# Patient Record
Sex: Male | Born: 2000 | Race: Black or African American | Hispanic: No | Marital: Single | Smoking: Former smoker
Health system: Southern US, Community
[De-identification: ages and names within clinical notes are randomized; demographics above are authoritative.]

## PROBLEM LIST (undated history)

## (undated) DIAGNOSIS — F419 Anxiety disorder, unspecified: Secondary | ICD-10-CM

## (undated) DIAGNOSIS — F32A Depression, unspecified: Secondary | ICD-10-CM

## (undated) DIAGNOSIS — L309 Dermatitis, unspecified: Secondary | ICD-10-CM

## (undated) DIAGNOSIS — J45909 Unspecified asthma, uncomplicated: Secondary | ICD-10-CM

## (undated) DIAGNOSIS — F329 Major depressive disorder, single episode, unspecified: Secondary | ICD-10-CM

---
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
## 2018-02-04 ENCOUNTER — Emergency Department (HOSPITAL_COMMUNITY)
Admission: EM | Admit: 2018-02-04 | Discharge: 2018-02-04 | Discharge status: Against Medical Advice | Payer: Medicaid Other | Attending: Emergency Medicine | Admitting: Emergency Medicine

## 2018-02-04 ENCOUNTER — Other Ambulatory Visit: Payer: Self-pay

## 2018-02-04 ENCOUNTER — Encounter (HOSPITAL_COMMUNITY): Payer: Self-pay | Admitting: Emergency Medicine

## 2018-02-04 DIAGNOSIS — R531 Weakness: Secondary | ICD-10-CM | POA: Diagnosis not present

## 2018-02-04 DIAGNOSIS — Z5321 Procedure and treatment not carried out due to patient leaving prior to being seen by health care provider: Secondary | ICD-10-CM | POA: Diagnosis not present

## 2018-02-04 NOTE — ED Notes (Addendum)
Called x3 for room no answer 

## 2018-02-04 NOTE — ED Triage Notes (Addendum)
Pt presents with EMS complaint generalized aches, chills, and cough this past week; worsening weakness related to such. Denies v/d.

## 2018-02-05 ENCOUNTER — Emergency Department (HOSPITAL_COMMUNITY)
Admission: EM | Admit: 2018-02-05 | Discharge: 2018-02-05 | Disposition: A | Discharge status: Home / Self Care | Payer: Medicaid Other | Attending: Emergency Medicine | Admitting: Emergency Medicine

## 2018-02-05 ENCOUNTER — Encounter (HOSPITAL_COMMUNITY): Payer: Self-pay | Admitting: *Deleted

## 2018-02-05 ENCOUNTER — Emergency Department (HOSPITAL_COMMUNITY): Payer: Medicaid Other

## 2018-02-05 DIAGNOSIS — F1290 Cannabis use, unspecified, uncomplicated: Secondary | ICD-10-CM | POA: Diagnosis not present

## 2018-02-05 DIAGNOSIS — R112 Nausea with vomiting, unspecified: Secondary | ICD-10-CM | POA: Diagnosis not present

## 2018-02-05 DIAGNOSIS — J069 Acute upper respiratory infection, unspecified: Secondary | ICD-10-CM | POA: Diagnosis not present

## 2018-02-05 DIAGNOSIS — F17210 Nicotine dependence, cigarettes, uncomplicated: Secondary | ICD-10-CM | POA: Insufficient documentation

## 2018-02-05 DIAGNOSIS — R05 Cough: Secondary | ICD-10-CM | POA: Diagnosis present

## 2018-02-05 MED ORDER — BENZONATATE 100 MG PO CAPS
100.0000 mg | ORAL_CAPSULE | Freq: Three times a day (TID) | ORAL | 0 refills | Status: DC
Start: 2018-02-05 — End: 2018-02-13

## 2018-02-05 MED ORDER — ALBUTEROL SULFATE HFA 108 (90 BASE) MCG/ACT IN AERS: 1.0000 | INHALATION_SPRAY | Freq: Four times a day (QID) | RESPIRATORY_TRACT | 0 refills | Status: DC | PRN

## 2018-02-05 MED ORDER — ONDANSETRON HCL 4 MG PO TABS: 4.0000 mg | ORAL_TABLET | Freq: Four times a day (QID) | ORAL | 0 refills | Status: AC

## 2018-02-05 NOTE — ED Provider Notes (Signed)
[facility] Provider Note   CSN: [id number] Arrival date & time: 02/05/18  1053     History   Chief Complaint Chief Complaint  Patient presents with  . Cough    HPI Francisco Cross is a 17 y.o. male.  HPI   Pt is a 17 y/o male with a h/o asthma, eczema who presents to the ED today for evaluation of productive cough that began 1 week ago.  Has had brown and red-tinged sputum. Reports sweats, chills, congestion.  Denies rhinorrhea, sore throat.  Pt states he has had nausea and vomiting (x3/day). No diarrhea. Pt states that he has been able to tolerate PO today. He states he has made himself vomit today secondary to nausea.  No persistent abdominal pain.  No urinary symptoms.  Past Medical History:  Diagnosis Date  . Asthma   . Eczema     There are no active problems to display for this patient.   History reviewed. No pertinent surgical history.    Home Medications    Prior to Admission medications   Medication Sig Start Date End Date Taking? Authorizing Provider  acetaminophen (TYLENOL) 500 MG tablet Take 1 tablet (500 mg total) by mouth every 6 (six) hours as needed. 10/07/14   Everlene Farrieransie, William, PA-C  albuterol (PROVENTIL HFA;VENTOLIN HFA) 108 (90 Base) MCG/ACT inhaler Inhale 1-2 puffs into the lungs every 6 (six) hours as needed for wheezing or shortness of breath. 02/05/18   Couture, Cortni S, PA-C  benzonatate (TESSALON) 100 MG capsule Take 1 capsule (100 mg total) by mouth every 8 (eight) hours for 5 days. 02/05/18 02/10/18  Couture, Cortni S, PA-C  ondansetron (ZOFRAN) 4 MG tablet Take 1 tablet (4 mg total) by mouth every 6 (six) hours for 5 doses. 02/05/18 02/07/18  Couture, Cortni S, PA-C    Family History History reviewed. No pertinent family history.  Social History Social History   Tobacco Use  . Smoking status: Current Some Day Smoker  . Smokeless tobacco: Never Used  Substance Use Topics  . Alcohol use: No  . Drug  use: Yes    Types: Marijuana     Allergies   Orange fruit [citrus]   Review of Systems Review of Systems  Constitutional: Negative for fever.  HENT: Positive for congestion. Negative for ear pain and sore throat.   Eyes: Negative for pain and visual disturbance.  Respiratory: Positive for cough.   Cardiovascular: Negative for leg swelling.  Gastrointestinal: Positive for nausea and vomiting. Negative for abdominal pain, constipation and diarrhea.  Genitourinary: Negative for dysuria and hematuria.  Musculoskeletal: Negative for back pain.  Skin: Negative for rash.  Neurological: Negative for headaches.  All other systems reviewed and are negative.    Physical Exam Updated Vital Signs BP 101/86 (BP Location: Right Arm)   Pulse 82   Temp 98.2 F (36.8 C) (Oral)   Resp 16   SpO2 98%   Physical Exam Vitals signs and nursing note reviewed.  Constitutional:      Appearance: He is well-developed.  HENT:     Head: Normocephalic and atraumatic.     Ears:     Comments: Fluid behind bilat TMs. No erythema or bulging    Nose: Nose normal. No congestion or rhinorrhea.     Mouth/Throat:     Mouth: Mucous membranes are moist.     Pharynx: No oropharyngeal exudate or posterior oropharyngeal erythema.  Eyes:     Conjunctiva/sclera: Conjunctivae normal.  Neck:  Musculoskeletal: Neck supple.  Cardiovascular:     Rate and Rhythm: Normal rate and regular rhythm.     Heart sounds: Normal heart sounds. No murmur.  Pulmonary:     Effort: Pulmonary effort is normal. No respiratory distress.     Breath sounds: Normal breath sounds. No stridor. No wheezing or rhonchi.  Abdominal:     General: Bowel sounds are normal. There is no distension.     Palpations: Abdomen is soft.     Tenderness: There is no abdominal tenderness. There is no guarding or rebound.  Lymphadenopathy:     Cervical: No cervical adenopathy.  Skin:    General: Skin is warm and dry.  Neurological:     Mental  Status: He is alert.      ED Treatments / Results  Labs (all labs ordered are listed, but only abnormal results are displayed) Labs Reviewed - No data to display  EKG None  Radiology Dg Chest 2 View  Result Date: 02/05/2018 CLINICAL DATA:  Possible Tylenol and Robitussin overdose over the past week. History of asthma, current smoker. EXAM: CHEST - 2 VIEW COMPARISON:  PA and lateral chest x-ray of November 21, 2010 FINDINGS: The lungs are well-expanded. There is no focal infiltrate. There is no pleural effusion. The heart and pulmonary vascularity are normal. The mediastinum is normal in width. The trachea is midline. The bony thorax exhibits no acute abnormality. IMPRESSION: There is no active cardiopulmonary disease. Mild hyperinflation may be voluntary or may reflect the history of asthma. Electronically Signed   By: David  SwazilandJordan M.D.   On: 02/05/2018 11:18    Procedures Procedures (including critical care time)  Medications Ordered in ED Medications - No data to display   Initial Impression / Assessment and Plan / ED Course  I have reviewed the triage vital signs and the nursing notes.  Pertinent labs & imaging results that were available during my care of the patient were reviewed by me and considered in my medical decision making (see chart for details).    Final Clinical Impressions(s) / ED Diagnoses   Final diagnoses:  Upper respiratory tract infection, unspecified type   Pt CXR negative for acute infiltrate. Patients symptoms are consistent with URI, likely viral etiology. Discussed that antibiotics are not indicated for viral infections. Pt will be discharged with symptomatic treatment.  He is also c/o nausea and vomiting. He states he has been able to tolerate po today. He does not appear dehydrated. abd soft and nontender. Do not feel further labs or imaging necessary. Will give rx for zofran for home. Verbalizes understanding and is agreeable with plan. Pt is  hemodynamically stable & in NAD prior to dc.  ED Discharge Orders         Ordered    albuterol (PROVENTIL HFA;VENTOLIN HFA) 108 (90 Base) MCG/ACT inhaler  Every 6 hours PRN     02/05/18 1249    benzonatate (TESSALON) 100 MG capsule  Every 8 hours     02/05/18 1249    ondansetron (ZOFRAN) 4 MG tablet  Every 6 hours     02/05/18 1249           Couture, Cortni S, PA-C 02/05/18 1250    Rolan BuccoBelfi, Melanie, MD 02/05/18 1255

## 2018-02-05 NOTE — ED Notes (Signed)
Pt stable, ambulatory, states understanding of discharge instructions 

## 2018-02-05 NOTE — ED Triage Notes (Signed)
Pt in c/o continued cough and congestion, no distress noted

## 2018-02-05 NOTE — Discharge Instructions (Signed)

## 2018-02-06 ENCOUNTER — Ambulatory Visit (HOSPITAL_COMMUNITY)
Admission: EM | Admit: 2018-02-06 | Discharge: 2018-02-06 | Disposition: A | Discharge status: Home / Self Care | Payer: Medicaid Other | Attending: Family Medicine | Admitting: Family Medicine

## 2018-02-06 ENCOUNTER — Encounter (HOSPITAL_COMMUNITY): Payer: Self-pay | Admitting: Emergency Medicine

## 2018-02-06 DIAGNOSIS — R112 Nausea with vomiting, unspecified: Secondary | ICD-10-CM | POA: Insufficient documentation

## 2018-02-06 DIAGNOSIS — E860 Dehydration: Secondary | ICD-10-CM | POA: Insufficient documentation

## 2018-02-06 MED ORDER — ONDANSETRON 4 MG PO TBDP: 4.0000 mg | ORAL_TABLET | Freq: Once | ORAL | Status: DC

## 2018-02-06 MED ORDER — ONDANSETRON HCL 4 MG/2ML IJ SOLN
INTRAMUSCULAR | Status: AC
  Filled 2018-02-06: qty 2

## 2018-02-06 MED ORDER — ONDANSETRON HCL 4 MG/2ML IJ SOLN
4.0000 mg | Freq: Once | INTRAMUSCULAR | Status: AC
  Administered 2018-02-06: 4 mg via INTRAVENOUS

## 2018-02-06 MED ORDER — PROMETHAZINE HCL 25 MG PO TABS: 25.0000 mg | ORAL_TABLET | Freq: Four times a day (QID) | ORAL | 0 refills | Status: DC | PRN

## 2018-02-06 MED ORDER — SODIUM CHLORIDE 0.9 % IV BOLUS
1000.0000 mL | Freq: Once | INTRAVENOUS | Status: AC
  Administered 2018-02-06: 1000 mL via INTRAVENOUS

## 2018-02-06 NOTE — ED Triage Notes (Signed)
Pt was evaluated yesterday and was told he had an URI, pt was given nausea medicine. Pt was given inhaler, tessalon, and zofran. States he keeps throwing up the medicine right after taking it. Pt in NAD at this time.

## 2018-02-06 NOTE — ED Provider Notes (Addendum)
[facility]    CSN: [id number] Arrival date & time: 02/06/18  1010     History   Chief Complaint Chief Complaint  Patient presents with  . Emesis    HPI Francisco Cross is a 18 y.o. male.   Patient is a 18 year old male with past medical history of asthma and eczema that presents today for vomiting.  He was seen yesterday in the ER and evaluated with diagnosis of viral upper respiratory infection.  He was sent home with albuterol inhaler, Tessalon Perles and Zofran to use for nausea vomiting.  Patient reporting that he is still vomiting after taking the Zofran and is unable to hold anything down.  Patient reports that he has lost 10 more pounds since he started his been very anxious about the whole situation.  He has had trouble sleeping at night.  Every time he eats or drinks anything he vomits.   ROS per HPI    Emesis  Associated symptoms: abdominal pain, cough and myalgias   Associated symptoms: no headaches     Past Medical History:  Diagnosis Date  . Asthma   . Eczema     There are no active problems to display for this patient.   History reviewed. No pertinent surgical history.     Home Medications    Prior to Admission medications   Medication Sig Start Date End Date Taking? Authorizing Provider  acetaminophen (TYLENOL) 500 MG tablet Take 1 tablet (500 mg total) by mouth every 6 (six) hours as needed. 10/07/14   Everlene Farrier, PA-C  albuterol (PROVENTIL HFA;VENTOLIN HFA) 108 (90 Base) MCG/ACT inhaler Inhale 1-2 puffs into the lungs every 6 (six) hours as needed for wheezing or shortness of breath. 02/05/18   Couture, Cortni S, PA-C  benzonatate (TESSALON) 100 MG capsule Take 1 capsule (100 mg total) by mouth every 8 (eight) hours for 5 days. 02/05/18 02/10/18  Couture, Cortni S, PA-C  ondansetron (ZOFRAN) 4 MG tablet Take 1 tablet (4 mg total) by mouth every 6 (six) hours for 5 doses. 02/05/18 02/07/18  Couture, Cortni S, PA-C  promethazine  (PHENERGAN) 25 MG tablet Take 1 tablet (25 mg total) by mouth every 6 (six) hours as needed for nausea or vomiting. 02/06/18   Janace Aris, NP    Family History Family History  Problem Relation Age of Onset  . Healthy Mother   . Healthy Father     Social History Social History   Tobacco Use  . Smoking status: Current Some Day Smoker  . Smokeless tobacco: Never Used  Substance Use Topics  . Alcohol use: No  . Drug use: Yes    Types: Marijuana     Allergies   Orange fruit [citrus]   Review of Systems Review of Systems  Constitutional: Positive for activity change, appetite change, fatigue and unexpected weight change.  HENT: Positive for congestion and rhinorrhea.   Respiratory: Positive for cough.   Gastrointestinal: Positive for abdominal pain, nausea and vomiting.  Endocrine: Negative for cold intolerance and heat intolerance.  Genitourinary: Negative for difficulty urinating and dysuria.  Musculoskeletal: Positive for myalgias. Negative for joint swelling and neck pain.  Neurological: Negative for dizziness, weakness and headaches.  Hematological: Negative for adenopathy. Does not bruise/bleed easily.     Physical Exam Triage Vital Signs ED Triage Vitals [02/06/18 1101]  Enc Vitals Group     BP 124/73     Pulse Rate 71     Resp 18  Temp 98.8 F (37.1 C)     Temp src      SpO2 98 %     Weight      Height      Head Circumference      Peak Flow      Pain Score      Pain Loc      Pain Edu?      Excl. in GC?    No data found.  Updated Vital Signs BP 124/73   Pulse 71   Temp 98.8 F (37.1 C)   Resp 18   SpO2 98%   Visual Acuity Right Eye Distance:   Left Eye Distance:   Bilateral Distance:    Right Eye Near:   Left Eye Near:    Bilateral Near:     Physical Exam Vitals signs and nursing note reviewed.  Constitutional:      General: He is not in acute distress.    Appearance: Normal appearance. He is ill-appearing. He is not  toxic-appearing or diaphoretic.  HENT:     Head: Normocephalic and atraumatic.     Right Ear: Tympanic membrane, ear canal and external ear normal.     Left Ear: Tympanic membrane, ear canal and external ear normal.     Nose: Nose normal.     Mouth/Throat:     Pharynx: Oropharynx is clear. No posterior oropharyngeal erythema.  Eyes:     Conjunctiva/sclera: Conjunctivae normal.  Neck:     Musculoskeletal: Normal range of motion and neck supple.  Cardiovascular:     Rate and Rhythm: Normal rate and regular rhythm.     Heart sounds: Normal heart sounds.  Pulmonary:     Effort: Pulmonary effort is normal.     Breath sounds: Normal breath sounds.  Abdominal:     General: There is no distension.     Palpations: Abdomen is soft. There is no mass.     Tenderness: There is no abdominal tenderness. There is no guarding or rebound.     Hernia: No hernia is present.  Musculoskeletal: Normal range of motion.  Skin:    General: Skin is warm and dry.  Neurological:     Mental Status: He is alert.  Psychiatric:        Mood and Affect: Mood normal.      UC Treatments / Results  Labs (all labs ordered are listed, but only abnormal results are displayed) Labs Reviewed - No data to display  EKG None  Radiology Dg Chest 2 View  Result Date: 02/05/2018 CLINICAL DATA:  Possible Tylenol and Robitussin overdose over the past week. History of asthma, current smoker. EXAM: CHEST - 2 VIEW COMPARISON:  PA and lateral chest x-ray of November 21, 2010 FINDINGS: The lungs are well-expanded. There is no focal infiltrate. There is no pleural effusion. The heart and pulmonary vascularity are normal. The mediastinum is normal in width. The trachea is midline. The bony thorax exhibits no acute abnormality. IMPRESSION: There is no active cardiopulmonary disease. Mild hyperinflation may be voluntary or may reflect the history of asthma. Electronically Signed   By: David  SwazilandJordan M.D.   On: 02/05/2018 11:18     Procedures Procedures (including critical care time)  Medications Ordered in UC Medications  sodium chloride 0.9 % bolus 1,000 mL (0 mLs Intravenous Stopped 02/06/18 1303)  ondansetron (ZOFRAN) injection 4 mg (4 mg Intravenous Given 02/06/18 1205)    Initial Impression / Assessment and Plan / UC Course  I have  reviewed the triage vital signs and the nursing notes.  Pertinent labs & imaging results that were available during my care of the patient were reviewed by me and considered in my medical decision making (see chart for details).     Patient still having nausea vomiting despite ODT Zofran that was prescribed yesterday in the ER. Patient mouth very dry and appears dehydrated We will give IV normal saline bolus here in clinic with IV Zofran. Will do oral trial of Gatorade to see if he can hold down fluids  Patient feeling slightly better after IV back and Zofran. Was able to tolerate Gatorade p.o. We will send and prescription for promethazine to see if this controls his nausea, vomiting better This is most likely a viral illness, no acute abdomen upon exam.  Vital signs stable, nontoxic. Instructed that if his symptoms return or if he is still unable to hold down any fluids that he needs to follow-up with his PCP or go to the ER for further evaluation and management. Patient understanding and agreeable to plan.  Final Clinical Impressions(s) / UC Diagnoses   Final diagnoses:  Dehydration  Nausea and vomiting, intractability of vomiting not specified, unspecified vomiting type     Discharge Instructions     We rehydrated you today with a bag of fluids. We also gave you Zofran for nausea and your IV I am sending a prescription for promethazine to the pharmacy for nausea, vomiting You can use every 6 hours as needed If your symptoms continue or worsen you need to get back to the hospital for further evaluation Otherwise follow-up with your PCP    ED Prescriptions     Medication Sig Dispense Auth. Provider   promethazine (PHENERGAN) 25 MG tablet Take 1 tablet (25 mg total) by mouth every 6 (six) hours as needed for nausea or vomiting. 30 tablet Dahlia Byes A, NP     Controlled Substance Prescriptions [place] Controlled Substance Registry consulted? Not Applicable   Janace Aris, NP 02/06/18 1306    Dahlia Byes A, NP 02/06/18 1309

## 2018-02-06 NOTE — Discharge Instructions (Addendum)
We rehydrated you today with a bag of fluids. We also gave you Zofran for nausea and your IV I am sending a prescription for promethazine to the pharmacy for nausea, vomiting You can use every 6 hours as needed If your symptoms continue or worsen you need to get back to the hospital for further evaluation Otherwise follow-up with your PCP

## 2018-02-06 NOTE — ED Notes (Signed)
Notified Traci, NP .  Instructed to give Gatorade for patient to sip. Gatorade given

## 2018-02-08 ENCOUNTER — Other Ambulatory Visit: Payer: Self-pay

## 2018-02-08 ENCOUNTER — Emergency Department (HOSPITAL_COMMUNITY)
Admission: EM | Admit: 2018-02-08 | Discharge: 2018-02-08 | Disposition: A | Discharge status: Home / Self Care | Payer: Medicaid Other | Attending: Emergency Medicine | Admitting: Emergency Medicine

## 2018-02-08 DIAGNOSIS — R1110 Vomiting, unspecified: Secondary | ICD-10-CM | POA: Diagnosis not present

## 2018-02-08 DIAGNOSIS — Z5321 Procedure and treatment not carried out due to patient leaving prior to being seen by health care provider: Secondary | ICD-10-CM | POA: Diagnosis not present

## 2018-02-08 LAB — COMPREHENSIVE METABOLIC PANEL
ALT: 23 U/L (ref 0–44)
AST: 28 U/L (ref 15–41)
Albumin: 4.4 g/dL (ref 3.5–5.0)
Alkaline Phosphatase: 59 U/L (ref 38–126)
Anion gap: 10 (ref 5–15)
BUN: 7 mg/dL (ref 6–20)
CO2: 26 mmol/L (ref 22–32)
Calcium: 9.9 mg/dL (ref 8.9–10.3)
Chloride: 104 mmol/L (ref 98–111)
Creatinine, Ser: 0.9 mg/dL (ref 0.61–1.24)
GFR calc Af Amer: >60 mL/min (ref >60)
GFR calc non Af Amer: >60 mL/min (ref >60)
Glucose, Bld: 107 mg/dL — ABNORMAL HIGH (ref 70–99)
Potassium: 2.9 mmol/L — ABNORMAL LOW (ref 3.5–5.1)
Sodium: 140 mmol/L (ref 135–145)
Total Bilirubin: 0.8 mg/dL (ref 0.3–1.2)
Total Protein: 7.7 g/dL (ref 6.5–8.1)

## 2018-02-08 LAB — CBC
HCT: 39.6 % (ref 39.0–52.0)
Hemoglobin: 13.5 g/dL (ref 13.0–17.0)
MCH: 31.7 pg (ref 26.0–34.0)
MCHC: 34.1 g/dL (ref 30.0–36.0)
MCV: 93 fL (ref 80.0–100.0)
Platelets: 453 10*3/uL — ABNORMAL HIGH (ref 150–400)
RBC: 4.26 MIL/uL (ref 4.22–5.81)
RDW: 11.7 % (ref 11.5–15.5)
WBC: 4.9 10*3/uL (ref 4.0–10.5)
nRBC: 0 % (ref 0.0–0.2)

## 2018-02-08 LAB — LIPASE, BLOOD: Lipase: 22 U/L (ref 11–51)

## 2018-02-08 NOTE — ED Notes (Signed)
Pt and mother approached this EMT at [facility] and were inquiring about the wait and results of lab works and other tests. This EMT informed pt and mother that results were not able to be discussed until they were seen by a provider however we are constantly monitoring results in the event that something critical comes back.   Pt informed this EMT he would be leaving because he had been gone for way too long. Pt had wrist band cut off and was seen leaving the department. Pt was noted to be ambulatory with steady gait and A&Ox4.

## 2018-02-08 NOTE — ED Triage Notes (Signed)
Pt BIB [facility] as transfer from UC for eval of N/V x 2 weeks w/ 15-20lbs of weight loss. Pt has been evaluated x3 for same complaints. Today EKG at UC was marked "abnormal" by MD, HR 118. Pt independently ambulatory w/ stable VS by EMS

## 2018-02-09 ENCOUNTER — Encounter (HOSPITAL_COMMUNITY): Payer: Self-pay | Admitting: *Deleted

## 2018-02-09 ENCOUNTER — Emergency Department (HOSPITAL_COMMUNITY)
Admission: EM | Admit: 2018-02-09 | Discharge: 2018-02-09 | Disposition: A | Discharge status: Home / Self Care | Payer: Medicaid Other | Attending: Emergency Medicine | Admitting: Emergency Medicine

## 2018-02-09 ENCOUNTER — Other Ambulatory Visit: Payer: Self-pay

## 2018-02-09 ENCOUNTER — Encounter (HOSPITAL_COMMUNITY): Payer: Self-pay

## 2018-02-09 DIAGNOSIS — E876 Hypokalemia: Secondary | ICD-10-CM

## 2018-02-09 DIAGNOSIS — F17200 Nicotine dependence, unspecified, uncomplicated: Secondary | ICD-10-CM | POA: Insufficient documentation

## 2018-02-09 DIAGNOSIS — J45909 Unspecified asthma, uncomplicated: Secondary | ICD-10-CM | POA: Insufficient documentation

## 2018-02-09 DIAGNOSIS — R45851 Suicidal ideations: Secondary | ICD-10-CM | POA: Diagnosis not present

## 2018-02-09 DIAGNOSIS — R002 Palpitations: Secondary | ICD-10-CM

## 2018-02-09 DIAGNOSIS — Z79899 Other long term (current) drug therapy: Secondary | ICD-10-CM | POA: Diagnosis not present

## 2018-02-09 DIAGNOSIS — F411 Generalized anxiety disorder: Secondary | ICD-10-CM | POA: Diagnosis not present

## 2018-02-09 LAB — MAGNESIUM: Magnesium: 2.5 mg/dL — ABNORMAL HIGH (ref 1.7–2.4)

## 2018-02-09 MED ORDER — SODIUM CHLORIDE 0.9 % IV BOLUS
1000.0000 mL | Freq: Once | INTRAVENOUS | Status: AC
  Administered 2018-02-09: 1000 mL via INTRAVENOUS

## 2018-02-09 MED ORDER — POTASSIUM CHLORIDE CRYS ER 20 MEQ PO TBCR
40.0000 meq | EXTENDED_RELEASE_TABLET | Freq: Once | ORAL | Status: AC
  Administered 2018-02-09: 40 meq via ORAL
  Filled 2018-02-09: qty 2

## 2018-02-09 MED ORDER — POTASSIUM CHLORIDE CRYS ER 20 MEQ PO TBCR: 20.0000 meq | EXTENDED_RELEASE_TABLET | Freq: Two times a day (BID) | ORAL | 0 refills | Status: DC

## 2018-02-09 MED ORDER — POTASSIUM CHLORIDE 10 MEQ/100ML IV SOLN
10.0000 meq | Freq: Once | INTRAVENOUS | Status: AC
  Administered 2018-02-09: 10 meq via INTRAVENOUS
  Filled 2018-02-09: qty 100

## 2018-02-09 NOTE — ED Notes (Signed)
Pt states he took an OxyContin on Monday and this is when symptoms began

## 2018-02-09 NOTE — ED Triage Notes (Signed)
Pt stated "I have thoughts of wanting to kill myself or kill others and I just jump up and run around."

## 2018-02-09 NOTE — ED Notes (Signed)
Patient finishing bolus and then will be discharged

## 2018-02-09 NOTE — Discharge Instructions (Signed)
You are seen in the emergency department today for abnormal heartbeat sensation.  Your work-up in the ER was overall reassuring.  Your potassium was low at 2.9, we gave you some oral and IV potassium, we have sent you with prescription for this as well.  Please see the attached diet guidelines.   Please maintain good hydration and eat regular meals throughout the day. Do not participate in exertional activities/ exercise until you have followed up with cardiology and or primary care- please do so within 3-5 days. Return to the ER for new or worsening symptoms or any other concerns.

## 2018-02-09 NOTE — ED Triage Notes (Addendum)
Pt bib [facility] with complaints of dizziness. EMS called out twice for same complaint. Pt has been nauseous. Pt not eating and drinking due to "not feeling well"  Pt concerned bc UC MD told him his EKG was abnormal.  Pt reports palpitations. Pt has known hx of anxiety and was prescribed Atarax from anxiety. Pt has not filled medication yet.

## 2018-02-09 NOTE — ED Provider Notes (Addendum)
[facility]-EMERGENCY DEPT Provider Note   CSN: [id number] Arrival date & time: 02/09/18  0747     History   Chief Complaint Chief Complaint  Patient presents with  . Palpitations    HPI Francisco Cross is a 18 y.o. male with a hx of tobacco abuse, asthma, and eczema who returns to the ED via EMS with complaints of intermittent palpitations over the past 5 days.  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  He states since this period of time he has had intermittent feelings of his heart beating quickly and or skipping beats.  He states this occurs multiple times per day and is brief in duration.  Palpitations seem to be triggered by stressful situations only and are accompanied by anxiety and some lightheadedness. He has had generally increased stress at home.  States he also becomes intermittently lightheaded when he transitions from sitting to standing.  He states he has had poor p.o. intake with a 10 to 20 pound weight loss over the past 2 weeks which he attributes to a GI illness with nausea and vomiting.  He has not had any vomiting over the past few days.  He reports generally poor p.o. intake.  Other than what is mentioned above no alleviating or aggravating factors.  He denies dizziness like the room spinning, chest pain, shortness of breath, syncope, leg pain/swelling, hemoptysis, prior history of blood clots or cancer, or hormone use.  He believes that his father started the family has some heart disease, however he does not know of any history of sudden cardiac death.  He states this was the first time he tried oxycodone.  He does admit to marijuana use.  Denies any other illicit substance use.  HPI  Past Medical History:  Diagnosis Date  . Asthma   . Eczema     There are no active  problems to display for this patient.   History reviewed. No pertinent surgical history.      Home Medications    Prior to Admission medications   Medication Sig Start Date End Date Taking? Authorizing Provider  acetaminophen (TYLENOL) 500 MG tablet Take 1 tablet (500 mg total) by mouth every 6 (six) hours as needed. 10/07/14   Everlene Farrieransie, William, PA-C  albuterol (PROVENTIL HFA;VENTOLIN HFA) 108 (90 Base) MCG/ACT inhaler Inhale 1-2 puffs into the lungs every 6 (six) hours as needed for wheezing or shortness of breath. 02/05/18   Couture, Cortni S, PA-C  benzonatate (TESSALON) 100 MG capsule Take 1 capsule (100 mg total) by mouth every 8 (eight) hours for 5 days. 02/05/18 02/10/18  Couture, Cortni S, PA-C  promethazine (PHENERGAN) 25 MG tablet Take 1 tablet (25 mg total) by mouth every 6 (six) hours as needed for nausea or vomiting. 02/06/18   Janace ArisBast, Traci A, NP    Family History Family History  Problem Relation Age of Onset  . Healthy Mother   . Healthy Father     Social History Social History   Tobacco Use  . Smoking status: Current Some Day Smoker  . Smokeless tobacco: Never Used  Substance Use Topics  . Alcohol use: No  . Drug use: Yes    Types: Marijuana     Allergies   Orange fruit [citrus]   Review of Systems Review  of Systems  Constitutional: Negative for chills and fever.  Respiratory: Negative for shortness of breath.   Cardiovascular: Positive for palpitations. Negative for chest pain and leg swelling.  Gastrointestinal: Negative for abdominal pain, blood in stool, constipation, diarrhea and vomiting.  Genitourinary: Negative for dysuria.  Neurological: Positive for light-headedness. Negative for weakness and numbness.  Psychiatric/Behavioral: Negative for hallucinations and suicidal ideas. The patient is nervous/anxious.   All other systems reviewed and are negative.    Physical Exam Updated Vital Signs BP 100/72   Pulse 84   Temp 98.6 F (37 C) (Oral)    Resp 16   Ht 5\' 10"  (1.778 m)   Wt 53.5 kg   SpO2 100%   BMI 16.92 kg/m   Physical Exam Vitals signs and nursing note reviewed.  Constitutional:      General: He is not in acute distress.    Appearance: He is well-developed. He is not toxic-appearing.  HENT:     Head: Normocephalic and atraumatic.     Mouth/Throat:     Mouth: Mucous membranes are dry.  Eyes:     General:        Right eye: No discharge.        Left eye: No discharge.     Conjunctiva/sclera: Conjunctivae normal.  Neck:     Musculoskeletal: Neck supple.  Cardiovascular:     Rate and Rhythm: Normal rate and regular rhythm.     Heart sounds: No murmur.  Pulmonary:     Effort: Pulmonary effort is normal. No respiratory distress.     Breath sounds: Normal breath sounds. No wheezing, rhonchi or rales.  Abdominal:     General: There is no distension.     Palpations: Abdomen is soft.     Tenderness: There is no abdominal tenderness.  Musculoskeletal:        General: No swelling or tenderness.     Right lower leg: No edema.     Left lower leg: No edema.  Skin:    General: Skin is warm and dry.     Findings: No rash.  Neurological:     Mental Status: He is alert.     Comments: Alert. Clear speech. No facial droop. CNIII-XII grossly intact. Bilateral upper and lower extremities' sensation grossly intact. 5/5 symmetric strength with grip strength and with plantar and dorsi flexion bilaterally. Normal finger to nose bilaterally. Negative pronator drift. Negative Romberg sign. Gait is steady and intact.    Psychiatric:        Behavior: Behavior normal.      ED Treatments / Results  Labs (all labs ordered are listed, but only abnormal results are displayed) Labs Reviewed  MAGNESIUM - Abnormal; Notable for the following components:      Result Value   Magnesium 2.5 (*)    All other components within normal limits  HIV ANTIBODY (ROUTINE TESTING W REFLEX)    EKG EKG Interpretation  Date/Time:  Saturday  February 09 2018 09:11:08 EST Ventricular Rate:  85 PR Interval:    QRS Duration: 84 QT Interval:  353 QTC Calculation: 420 R Axis:   90 Text Interpretation:  Sinus rhythm Borderline short PR interval Borderline right axis deviation No significant change since last tracing Confirmed by Gwyneth SproutPlunkett, Whitney ([id number]) on 02/09/2018 9:22:24 AM   Radiology No results found.  Procedures Procedures (including critical care time)  Medications Ordered in ED Medications  potassium chloride 10 mEq in 100 mL IVPB (0 mEq Intravenous Stopped 02/09/18 1045)  potassium  chloride SA (K-DUR,KLOR-CON) CR tablet 40 mEq (40 mEq Oral Given 02/09/18 0933)  sodium chloride 0.9 % bolus 1,000 mL (1,000 mLs Intravenous New Bag/Given 02/09/18 0931)     Initial Impression / Assessment and Plan / ED Course  I have reviewed the triage vital signs and the nursing notes.  Pertinent labs & imaging results that were available during my care of the patient were reviewed by me and considered in my medical decision making (see chart for details).   Patient presents to the emergency department with complaints of intermittent palpitations associated with increased stress.  Patient nontoxic-appearing, no apparent distress, initial tachycardia normalized throughout my assessment and throughout ER visit.  Benign exam.  No neuro deficits.  Labs drawn including CBC & CMP at yesterday's ER visit when patient was not evaluated have been reviewed, given these are within past 24 hours do not feel the need for repeat today. Patient with hypokalemia at 2.9, EKG without concerning changes, magnesium obtained and normal, likely secondary to prior vomiting and poor PO intake- orally and parenterally replaced, short term prescription to go home with as well. No other electrolyte derangements. Mg added today slightly elevated. CBC without anemia or leukocytosis. Platelets elevated- PCP recheck. Orthostatic vitals non-concerning. HIV antibody obtained  with hx of drug use and reported weight loss- remains pending.   EKG without significant change from prior. Cardiac monitor reviewed, has remained in NSR without concerning arrhythmia or abnormal findings. EKG is not ischemic- doubt ACS. PERC negative doubt PE. Unclear definitively etiology to sxs, possible anxiety related, possible substance use related, feel patient would benefit from cardiology & PCP follow up. Exercise restrictions in the mean time. Hemodynamically stable and feeling improved after fluids, seems safe for discharge home. I discussed results, treatment plan, need for follow-up, and return precautions with the patient. Provided opportunity for questions, patient confirmed understanding and is in agreement with plan.   Findings and plan of care discussed with supervising physician Dr. Anitra Lauth- in agreement.    Final Clinical Impressions(s) / ED Diagnoses   Final diagnoses:  Palpitations  Hypokalemia    ED Discharge Orders         Ordered    potassium chloride SA (K-DUR,KLOR-CON) 20 MEQ tablet  2 times daily     02/09/18 [address], [place] R, PA-C 02/09/18 1120    [address], PA-C 02/09/18 1121    Gwyneth Sprout, MD 02/11/18 2225

## 2018-02-10 ENCOUNTER — Emergency Department (HOSPITAL_COMMUNITY)
Admission: EM | Admit: 2018-02-10 | Discharge: 2018-02-10 | Disposition: A | Discharge status: Other Acute Inpatient Hospital | Payer: Medicaid Other | Source: Home / Self Care | Attending: Emergency Medicine | Admitting: Emergency Medicine

## 2018-02-10 ENCOUNTER — Other Ambulatory Visit: Payer: Self-pay

## 2018-02-10 ENCOUNTER — Encounter (HOSPITAL_COMMUNITY): Payer: Self-pay | Admitting: *Deleted

## 2018-02-10 ENCOUNTER — Inpatient Hospital Stay (HOSPITAL_COMMUNITY)
Admission: AD | Admit: 2018-02-10 | Discharge: 2018-02-13 | DRG: 880 | Disposition: A | Discharge status: Home / Self Care | Payer: Medicaid Other | Source: Intra-hospital | Attending: Psychiatry | Admitting: Psychiatry

## 2018-02-10 DIAGNOSIS — R002 Palpitations: Secondary | ICD-10-CM | POA: Diagnosis not present

## 2018-02-10 DIAGNOSIS — F1210 Cannabis abuse, uncomplicated: Secondary | ICD-10-CM | POA: Diagnosis present

## 2018-02-10 DIAGNOSIS — F332 Major depressive disorder, recurrent severe without psychotic features: Secondary | ICD-10-CM | POA: Diagnosis not present

## 2018-02-10 DIAGNOSIS — Z79899 Other long term (current) drug therapy: Secondary | ICD-10-CM | POA: Diagnosis not present

## 2018-02-10 DIAGNOSIS — H538 Other visual disturbances: Secondary | ICD-10-CM | POA: Diagnosis not present

## 2018-02-10 DIAGNOSIS — R45851 Suicidal ideations: Secondary | ICD-10-CM

## 2018-02-10 DIAGNOSIS — G4700 Insomnia, unspecified: Secondary | ICD-10-CM | POA: Diagnosis not present

## 2018-02-10 DIAGNOSIS — F29 Unspecified psychosis not due to a substance or known physiological condition: Secondary | ICD-10-CM | POA: Diagnosis not present

## 2018-02-10 DIAGNOSIS — Z91018 Allergy to other foods: Secondary | ICD-10-CM | POA: Diagnosis not present

## 2018-02-10 DIAGNOSIS — F410 Panic disorder [episodic paroxysmal anxiety] without agoraphobia: Secondary | ICD-10-CM | POA: Diagnosis not present

## 2018-02-10 DIAGNOSIS — J45909 Unspecified asthma, uncomplicated: Secondary | ICD-10-CM | POA: Diagnosis not present

## 2018-02-10 DIAGNOSIS — Z87891 Personal history of nicotine dependence: Secondary | ICD-10-CM | POA: Diagnosis not present

## 2018-02-10 HISTORY — DX: Major depressive disorder, single episode, unspecified: F32.9

## 2018-02-10 HISTORY — DX: Anxiety disorder, unspecified: F41.9

## 2018-02-10 HISTORY — DX: Depression, unspecified: F32.A

## 2018-02-10 LAB — COMPREHENSIVE METABOLIC PANEL
ALT: 24 U/L (ref 0–44)
AST: 41 U/L (ref 15–41)
Albumin: 4.6 g/dL (ref 3.5–5.0)
Alkaline Phosphatase: 65 U/L (ref 38–126)
Anion gap: 10 (ref 5–15)
BUN: 6 mg/dL (ref 6–20)
CO2: 24 mmol/L (ref 22–32)
Calcium: 9.9 mg/dL (ref 8.9–10.3)
Chloride: 106 mmol/L (ref 98–111)
Creatinine, Ser: 0.82 mg/dL (ref 0.61–1.24)
GFR calc Af Amer: >60 mL/min (ref >60)
GFR calc non Af Amer: >60 mL/min (ref >60)
Glucose, Bld: 110 mg/dL — ABNORMAL HIGH (ref 70–99)
Potassium: 3.5 mmol/L (ref 3.5–5.1)
Sodium: 140 mmol/L (ref 135–145)
Total Bilirubin: 0.6 mg/dL (ref 0.3–1.2)
Total Protein: 7.9 g/dL (ref 6.5–8.1)

## 2018-02-10 LAB — CBC
HCT: 40.5 % (ref 39.0–52.0)
Hemoglobin: 13.6 g/dL (ref 13.0–17.0)
MCH: 32 pg (ref 26.0–34.0)
MCHC: 33.6 g/dL (ref 30.0–36.0)
MCV: 95.3 fL (ref 80.0–100.0)
Platelets: 463 10*3/uL — ABNORMAL HIGH (ref 150–400)
RBC: 4.25 MIL/uL (ref 4.22–5.81)
RDW: 11.9 % (ref 11.5–15.5)
WBC: 7 10*3/uL (ref 4.0–10.5)
nRBC: 0 % (ref 0.0–0.2)

## 2018-02-10 LAB — ACETAMINOPHEN LEVEL: Acetaminophen (Tylenol), Serum: <10 ug/mL — ABNORMAL LOW (ref 10–30)

## 2018-02-10 LAB — RAPID URINE DRUG SCREEN, HOSP PERFORMED
Amphetamines: NOT DETECTED
Barbiturates: NOT DETECTED
Benzodiazepines: NOT DETECTED
Cocaine: NOT DETECTED
Opiates: NOT DETECTED
Tetrahydrocannabinol: POSITIVE — AB

## 2018-02-10 LAB — ETHANOL: Alcohol, Ethyl (B): <10 mg/dL (ref <10)

## 2018-02-10 LAB — SALICYLATE LEVEL: Salicylate Lvl: <7 mg/dL (ref 2.8–30.0)

## 2018-02-10 LAB — HIV ANTIBODY (ROUTINE TESTING W REFLEX): HIV Screen 4th Generation wRfx: NONREACTIVE

## 2018-02-10 MED ORDER — ACETAMINOPHEN 325 MG PO TABS: 650.0000 mg | ORAL_TABLET | Freq: Four times a day (QID) | ORAL | Status: DC | PRN

## 2018-02-10 MED ORDER — MAGNESIUM HYDROXIDE 400 MG/5ML PO SUSP: 30.0000 mL | Freq: Every day | ORAL | Status: DC | PRN

## 2018-02-10 MED ORDER — ALUM & MAG HYDROXIDE-SIMETH 200-200-20 MG/5ML PO SUSP: 30.0000 mL | ORAL | Status: DC | PRN

## 2018-02-10 MED ORDER — HYDROXYZINE HCL 25 MG PO TABS
25.0000 mg | ORAL_TABLET | Freq: Three times a day (TID) | ORAL | Status: DC | PRN
  Administered 2018-02-10 – 2018-02-11 (×2): 25 mg via ORAL
  Filled 2018-02-10 (×2): qty 1

## 2018-02-10 MED ORDER — ENSURE ENLIVE PO LIQD: 237.0000 mL | Freq: Two times a day (BID) | ORAL | Status: DC

## 2018-02-10 MED ORDER — TRAZODONE HCL 50 MG PO TABS
50.0000 mg | ORAL_TABLET | Freq: Every evening | ORAL | Status: DC | PRN
  Administered 2018-02-12: 50 mg via ORAL
  Filled 2018-02-10: qty 1

## 2018-02-10 MED ORDER — NICOTINE 14 MG/24HR TD PT24
14.0000 mg | MEDICATED_PATCH | Freq: Every day | TRANSDERMAL | Status: DC
  Administered 2018-02-11: 14 mg via TRANSDERMAL
  Filled 2018-02-10 (×5): qty 1

## 2018-02-10 MED ORDER — LORAZEPAM 1 MG PO TABS
1.0000 mg | ORAL_TABLET | Freq: Once | ORAL | Status: AC
  Administered 2018-02-10: 1 mg via ORAL
  Filled 2018-02-10: qty 1

## 2018-02-10 NOTE — Progress Notes (Signed)
Patient did not attend wrap up group. 

## 2018-02-10 NOTE — ED Notes (Signed)
Pt awake, talking on hallway phone. 

## 2018-02-10 NOTE — ED Notes (Signed)
Dr Manus Gunning at bedside to eval pt.  Marcus with TTS eval pt at present.

## 2018-02-10 NOTE — ED Notes (Signed)
Pt tolerating a Ham Sandwich and Crackers at present.

## 2018-02-10 NOTE — BH Assessment (Signed)
Assessment Note  Francisco Cross is an 18 y.o. male.  -Clinician reviewed note by Dr. Manus Gunning.  Patient presents with intermittent "hallucinations".  He states he is not hearing voices or seeing things but feels like his "body is dying".  He told the triage nurse he was having thoughts of wanting to hurt himself and occasionally has thoughts of wanting to hurt his mother.  He denies an active plan.  He denies hearing voices.  He admits to using oxycodone several days ago but denies any other drug use.  No alcohol use.  Patient says he took a pill, an oxycotin 230 on Monday.  He says that since then he has not felt right.  He also says that he had two friends die in the last couple of weeks.  Patient says he has been calling EMS four times in the last few days.  He came to [facility] himself because he felt bad whenever he took any medications.  Pt has some intermittent thoughts of killing himself but he says he has no plan.  He reports no previous suicide attempts.  Patient says he will have some passing thoughts of harming his grandmother, with whom he and his 10 month old daughter live.  Patient denies any A/V hallucinations.  Patient says he uses marijuana daily but has not used any since a few days ago.  Patient appears anxious.  He says he will feel fine for awhile then feel like "everything is falling apart."  He has racing thoughts at night.  He also reports having lost 20 lbs over the last couple weeks, no appetite.  Patient complains often of feeling dehydrated.  Patient denies any previous inpatient or outpatient mental health care.  -Clinician discussed patient care with Nira Conn, FNP.  He recommends patient be observed for safety tonight and bee seen by psychiatry in AM.  Clinician talked with Dr. Manus Gunning he is fine with patient being seen in the morning by psychiatry.  Diagnosis: F41.1 Generalized anxiety d/o  Past Medical History:  Past Medical History:  Diagnosis Date  . Asthma    . Eczema     History reviewed. No pertinent surgical history.  Family History:  Family History  Problem Relation Age of Onset  . Healthy Mother   . Healthy Father     Social History:  reports that he has been smoking. He has never used smokeless tobacco. He reports current drug use. Drug: Marijuana. He reports that he does not drink alcohol.  Additional Social History:  Alcohol / Drug Use Pain Medications: See PTA medication list Prescriptions: See PTA medication list Over the Counter: See PTA medication list History of alcohol / drug use?: Yes Substance #1 Name of Substance 1: Marijuana 1 - Age of First Use: Teens 1 - Amount (size/oz): Blunt 1 - Frequency: daily 1 - Duration: off and on 1 - Last Use / Amount: 02/06/18  CIWA: CIWA-Ar BP: (!) 144/86 Pulse Rate: 91 COWS:    Allergies:  Allergies  Allergen Reactions  . Orange Fruit [Citrus]     States orange and orange flavored things give him migraines.    Home Medications: (Not in a hospital admission)   OB/GYN Status:  No LMP for male patient.  General Assessment Data Location of Assessment: [facility] ED TTS Assessment: In system Is this a Tele or Face-to-Face Assessment?: Face-to-Face Is this an Initial Assessment or a Re-assessment for this encounter?: Initial Assessment Patient Accompanied by:: N/A Language Other than English: No Living Arrangements:  Other (Comment)(Lives with grandmother and his daughter.) What gender do you identify as?: Male Marital status: Single Pregnancy Status: No Living Arrangements: Other relatives Can pt return to current living arrangement?: Yes Admission Status: Voluntary Is patient capable of signing voluntary admission?: Yes Referral Source: Self/Family/Friend([facility] Insurance type: self pay     Crisis Care Plan Living Arrangements: Other relatives Name of Psychiatrist: None Name of Therapist: None  Education Status Is patient currently in school?:  No Is the patient employed, unemployed or receiving disability?: Unemployed  Risk to self with the past 6 months Suicidal Ideation: Yes-Currently Present Has patient been a risk to self within the past 6 months prior to admission? : No Suicidal Intent: No Has patient had any suicidal intent within the past 6 months prior to admission? : No Is patient at risk for suicide?: No Suicidal Plan?: No Has patient had any suicidal plan within the past 6 months prior to admission? : No Access to Means: No What has been your use of drugs/alcohol within the last 12 months?: THC Previous Attempts/Gestures: No How many times?: 0 Other Self Harm Risks: None Triggers for Past Attempts: None known Intentional Self Injurious Behavior: None Family Suicide History: No Recent stressful life event(s): Loss (Comment)(Two friends dying) Persecutory voices/beliefs?: No Depression: Yes Depression Symptoms: Feeling worthless/self pity Substance abuse history and/or treatment for substance abuse?: Yes Suicide prevention information given to non-admitted patients: Not applicable  Risk to Others within the past 6 months Homicidal Ideation: No Does patient have any lifetime risk of violence toward others beyond the six months prior to admission? : No Thoughts of Harm to Others: Yes-Currently Present Comment - Thoughts of Harm to Others: Once in a while will want to harm grandmother Current Homicidal Intent: No Current Homicidal Plan: No Access to Homicidal Means: No Identified Victim: [email or web address] History of harm to others?: No Assessment of Violence: None Noted Violent Behavior Description: None reported Does patient have access to weapons?: No Criminal Charges Pending?: No Does patient have a court date: No Is patient on probation?: No  Psychosis Hallucinations: None noted Delusions: None noted  Mental Status Report Appearance/Hygiene: Disheveled, In scrubs Eye Contact: Fair Motor Activity:  Freedom of movement, Restlessness Speech: Logical/coherent Level of Consciousness: Alert Mood: Depressed, Anxious, Despair, Sad, Helpless Affect: Anxious, Depressed Anxiety Level: Panic Attacks Panic attack frequency: Daily Most recent panic attack: Tonight Thought Processes: Coherent, Relevant Judgement: Impaired Orientation: Person, Place, Time, Situation Obsessive Compulsive Thoughts/Behaviors: None  Cognitive Functioning Concentration: Decreased Memory: Recent Intact, Remote Intact Is patient IDD: No Insight: Poor Impulse Control: Poor Appetite: Poor Have you had any weight changes? : Loss Amount of the weight change? (lbs): 20 lbs Sleep: Decreased Total Hours of Sleep: (Sleeping during day) Vegetative Symptoms: None  ADLScreening [facility] Assessment Services) Patient's cognitive ability adequate to safely complete daily activities?: Yes Patient able to express need for assistance with ADLs?: Yes Independently performs ADLs?: Yes (appropriate for developmental age)  Prior Inpatient Therapy Prior Inpatient Therapy: No  Prior Outpatient Therapy Prior Outpatient Therapy: No Does patient have an ACCT team?: No Does patient have Intensive In-House Services?  : No Does patient have [facility] services? : No Does patient have P4CC services?: No  ADL Screening (condition at time of admission) Patient's cognitive ability adequate to safely complete daily activities?: Yes Is the patient deaf or have difficulty hearing?: No Does the patient have difficulty seeing, even when wearing glasses/contacts?: No Does the patient have difficulty concentrating, remembering, or making decisions?: Yes  Patient able to express need for assistance with ADLs?: Yes Does the patient have difficulty dressing or bathing?: No Independently performs ADLs?: Yes (appropriate for developmental age) Does the patient have difficulty walking or climbing stairs?: No Weakness of Legs: None Weakness of  Arms/Hands: None       Abuse/Neglect Assessment (Assessment to be complete while patient is alone) Abuse/Neglect Assessment Can Be Completed: Yes Physical Abuse: Denies Verbal Abuse: Denies Sexual Abuse: Denies Exploitation of patient/patient's resources: Denies Self-Neglect: Denies     Merchant navy officer (For Healthcare) Does Patient Have a Medical Advance Directive?: No Would patient like information on creating a medical advance directive?: No - Patient declined          Disposition:  Disposition Initial Assessment Completed for this Encounter: Yes Patient referred to: Other (Comment)(Pt to be reviewed by psychiatry.)  On Site Evaluation by:   Reviewed with Physician:    Beatriz Stallion Ray 02/10/2018 1:50 AM

## 2018-02-10 NOTE — [facility] Group Notes (Signed)
[facility] Group Notes:  (Nursing/MHT/Case Management/Adjunct)  Date:  02/10/2018  Time:  4:15 pm  Type of Therapy:  Psychoeducational Skills  Participation Level:  Active  Participation Quality:  Appropriate  Affect:  Appropriate  Cognitive:  Appropriate  Insight:  Appropriate  Engagement in Group:  Engaged  Modes of Intervention:  Education  Summary of Progress/Problems:  Patient was alert and active during group.   [name] 02/10/2018, 6:52 PM

## 2018-02-10 NOTE — ED Notes (Signed)
Pt denies SI, HI or AVH, presents with complaint of dehydration, stating his Potassium has been off.  A&O x 3, no distress noted, calm & cooperative, states his mother upset him earlier tonight.  Monitoring for safety, Q 15 min checks in effect.  Comfort measures given.  Pt given Water and Gatorade to drink.

## 2018-02-10 NOTE — ED Notes (Signed)
Pt taking a shower 

## 2018-02-10 NOTE — ED Provider Notes (Signed)
[facility]-EMERGENCY DEPT Provider Note   CSN: [id number] Arrival date & time: 02/09/18  2325     History   Chief Complaint Chief Complaint  Patient presents with  . Suicidal    HPI Francisco Cross is a 18 y.o. male.  Patient presents with intermittent "hallucinations".  He states he is not hearing voices or seeing things but feels like his "body is dying".  He was seen this morning for palpitations and dehydration and was concerned about his hypokalemia.  He had multiple ED visits in the past several days.  He told the triage nurse he was having thoughts of wanting to hurt himself and occasionally has thoughts of wanting to hurt his mother.  He denies an active plan.  He denies hearing voices.  He admits to using oxycodone several days ago but denies any other drug use.  No alcohol use.  Denies any psychiatric conditions or psychiatric medications.  No chest pain or shortness of breath.  He was seen earlier today for palpitations in the setting of stressful situations and anxiety.  He states he is increasingly stressed and anxious and is worried that he could try to hurt himself or someone else.  No chest pain or shortness of breath currently.  Does admit to marijuana use. He has not felt right since using oxycodone several days ago and several ED and UC visits for dehydration and palpitations.  The history is provided by the patient.    Past Medical History:  Diagnosis Date  . Asthma   . Eczema     There are no active problems to display for this patient.   History reviewed. No pertinent surgical history.      Home Medications    Prior to Admission medications   Medication Sig Start Date End Date Taking? Authorizing Provider  albuterol (PROVENTIL HFA;VENTOLIN HFA) 108 (90 Base) MCG/ACT inhaler Inhale 1-2 puffs into the lungs every 6 (six) hours as needed for wheezing or shortness of breath. 02/05/18  Yes Couture, Cortni S, PA-C  benzonatate  (TESSALON) 100 MG capsule Take 1 capsule (100 mg total) by mouth every 8 (eight) hours for 5 days. 02/05/18 02/10/18 Yes Couture, Cortni S, PA-C  potassium chloride SA (K-DUR,KLOR-CON) 20 MEQ tablet Take 1 tablet (20 mEq total) by mouth 2 (two) times daily. 02/09/18   Petrucelli, Pleas KochSamantha R, PA-C  promethazine (PHENERGAN) 25 MG tablet Take 1 tablet (25 mg total) by mouth every 6 (six) hours as needed for nausea or vomiting. 02/06/18   Janace ArisBast, Traci A, NP    Family History Family History  Problem Relation Age of Onset  . Healthy Mother   . Healthy Father     Social History Social History   Tobacco Use  . Smoking status: Current Some Day Smoker  . Smokeless tobacco: Never Used  Substance Use Topics  . Alcohol use: No  . Drug use: Yes    Types: Marijuana     Allergies   Orange fruit [citrus]   Review of Systems Review of Systems  Constitutional: Negative for activity change and appetite change.  HENT: Negative for congestion and rhinorrhea.   Eyes: Negative for visual disturbance.  Cardiovascular: Positive for palpitations.  Gastrointestinal: Negative for abdominal pain, nausea and vomiting.  Genitourinary: Negative for dysuria and hematuria.  Musculoskeletal: Negative for back pain.  Neurological: Negative for dizziness, weakness and headaches.   all other systems are negative except as noted in the HPI and PMH.     Physical  Exam Updated Vital Signs BP (!) 144/86 (BP Location: Left Arm)   Pulse 91   Temp 97.7 F (36.5 C) (Oral)   Resp 18   Ht 5\' 10"  (1.778 m)   Wt 53.5 kg   SpO2 100%   BMI 16.93 kg/m   Physical Exam Vitals signs and nursing note reviewed.  Constitutional:      General: He is not in acute distress.    Appearance: He is well-developed.  HENT:     Head: Normocephalic and atraumatic.     Mouth/Throat:     Pharynx: No oropharyngeal exudate.  Eyes:     Conjunctiva/sclera: Conjunctivae normal.     Pupils: Pupils are equal, round, and reactive to  light.  Neck:     Musculoskeletal: Normal range of motion and neck supple.     Comments: No meningismus. Cardiovascular:     Rate and Rhythm: Normal rate and regular rhythm.     Heart sounds: Normal heart sounds. No murmur.  Pulmonary:     Effort: Pulmonary effort is normal. No respiratory distress.     Breath sounds: Normal breath sounds.  Abdominal:     Palpations: Abdomen is soft.     Tenderness: There is no abdominal tenderness. There is no guarding or rebound.  Musculoskeletal: Normal range of motion.        General: No tenderness.  Skin:    General: Skin is warm.  Neurological:     Mental Status: He is alert and oriented to person, place, and time.     Cranial Nerves: No cranial nerve deficit.     Motor: No abnormal muscle tone.     Coordination: Coordination normal.     Comments:  5/5 strength throughout. CN 2-12 intact.Equal grip strength.   Psychiatric:        Behavior: Behavior normal.     Comments: anxious      ED Treatments / Results  Labs (all labs ordered are listed, but only abnormal results are displayed) Labs Reviewed  COMPREHENSIVE METABOLIC PANEL - Abnormal; Notable for the following components:      Result Value   Glucose, Bld 110 (*)    All other components within normal limits  ACETAMINOPHEN LEVEL - Abnormal; Notable for the following components:   Acetaminophen (Tylenol), Serum <10 (*)    All other components within normal limits  CBC - Abnormal; Notable for the following components:   Platelets 463 (*)    All other components within normal limits  RAPID URINE DRUG SCREEN, HOSP PERFORMED - Abnormal; Notable for the following components:   Tetrahydrocannabinol POSITIVE (*)    All other components within normal limits  ETHANOL  SALICYLATE LEVEL    EKG None  Radiology No results found.  Procedures Procedures (including critical care time)  Medications Ordered in ED Medications - No data to display   Initial Impression / Assessment  and Plan / ED Course  I have reviewed the triage vital signs and the nursing notes.  Pertinent labs & imaging results that were available during my care of the patient were reviewed by me and considered in my medical decision making (see chart for details).    Anxiety with vague SI and HI. No plan at this time. Recent visits for palpitations after using oxycodone.   UDS with THC. Labs reassuring.  EKG unchanged.  Patient medically clear for psychiatric evaluation. Holding orders placed.  Final Clinical Impressions(s) / ED Diagnoses   Final diagnoses:  Suicidal ideation  ED Discharge Orders    None       Rancour, Jeannett Senior, MD 02/10/18 [phone]

## 2018-02-10 NOTE — Progress Notes (Signed)
Patient is 18 yrs old, voluntary, from [facility] ED, first admission to [facility].  Patient stated he was abandoned by his parents as a young child, that his grandmother Leticia Penna raised him.  Continues to live with his grandmother and 36 month old daughter.  Dropped out of 11th grade.  Talked about "rap music".  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  Oxycodone bought off street, using approximately one year, last used Monday.  Alcohol for stress, escape, since age of 18 yrs old, 2-3 cups liquor daily.  Denied SI during admission, was SI yesterday.  Denied HI.  Denied A/V hallucinations.   Rated depression, anxiety, hopeless #10.  Stated his dad told him heart problems run in his family, family member in hospital dying, which scared patient and he came into hospital, afraid he will die if he does not stop using drugs.  Tattoo R upper arm and L lower arm.  Old surgical scar on L lower foot, ankle area.  Patient oriented 400 hall, given food/drink. Fall risk information given to patient, low fall risk. Locker 14 has black sweats, red hoodie, green sneakers, black phone, white tank.

## 2018-02-10 NOTE — ED Notes (Signed)
Bed: WTR6 Expected date:  Expected time:  Means of arrival:  Comments: 

## 2018-02-10 NOTE — Tx Team (Signed)
Initial Treatment Plan 02/10/2018 6:35 PM Francisco Cross [id number]    PATIENT STRESSORS: Educational concerns Financial difficulties Occupational concerns Substance abuse Traumatic event   PATIENT STRENGTHS: Ability for insight Average or above average intelligence Capable of independent living Communication skills Motivation for treatment/growth Supportive family/friends   PATIENT IDENTIFIED PROBLEMS: "anxiety'  "depression"  "panic"  "suicide thoughts"  "substance abuse"             DISCHARGE CRITERIA:  Ability to meet basic life and health needs Adequate post-discharge living arrangements Improved stabilization in mood, thinking, and/or behavior Medical problems require only outpatient monitoring Motivation to continue treatment in a less acute level of care Need for constant or close observation no longer present Reduction of life-threatening or endangering symptoms to within safe limits Safe-care adequate arrangements made Verbal commitment to aftercare and medication compliance Withdrawal symptoms are absent or subacute and managed without 24-hour nursing intervention  PRELIMINARY DISCHARGE PLAN: Attend aftercare/continuing care group Attend PHP/IOP Attend 12-step recovery group Outpatient therapy Return to previous living arrangement  PATIENT/FAMILY INVOLVEMENT: This treatment plan has been presented to and reviewed with the patient, Francisco Cross.  The patient and family have been given the opportunity to ask questions and make suggestions.  [name], RN 02/10/2018, 6:35 PM

## 2018-02-10 NOTE — ED Notes (Signed)
[facility] transport on unit to transfer pt to [facility] Adult unit per MD order. Personal property given to [facility] for transfer. Ambulatory off unit.

## 2018-02-10 NOTE — Plan of Care (Signed)
Nurse discussed anxiety, depression, coping skills with patient. 

## 2018-02-10 NOTE — ED Notes (Signed)
Pt becomingly increasingly anxious, c/o "panick attack". Pt delusional. Encouragement and support provided. Pt reports nightmares last night. Also informs this nurse this started with the recent death of his friends. This nurse notified EDP of pt symptoms. Awaiting orders.

## 2018-02-11 DIAGNOSIS — F332 Major depressive disorder, recurrent severe without psychotic features: Secondary | ICD-10-CM

## 2018-02-11 DIAGNOSIS — F410 Panic disorder [episodic paroxysmal anxiety] without agoraphobia: Principal | ICD-10-CM

## 2018-02-11 LAB — GLUCOSE, CAPILLARY: Glucose-Capillary: 102 mg/dL — ABNORMAL HIGH (ref 70–99)

## 2018-02-11 MED ORDER — SERTRALINE HCL 50 MG PO TABS
50.0000 mg | ORAL_TABLET | Freq: Every day | ORAL | Status: DC
  Administered 2018-02-11 – 2018-02-12 (×2): 50 mg via ORAL
  Filled 2018-02-11 (×3): qty 1

## 2018-02-11 MED ORDER — LORAZEPAM 1 MG PO TABS
ORAL_TABLET | ORAL | Status: AC
  Administered 2018-02-11: 10:00:00
  Filled 2018-02-11: qty 1

## 2018-02-11 MED ORDER — LORAZEPAM 1 MG PO TABS
1.0000 mg | ORAL_TABLET | Freq: Three times a day (TID) | ORAL | Status: DC | PRN
  Administered 2018-02-11: 1 mg via ORAL
  Filled 2018-02-11: qty 1

## 2018-02-11 MED ORDER — ENSURE ENLIVE PO LIQD
237.0000 mL | Freq: Three times a day (TID) | ORAL | Status: DC
  Administered 2018-02-11 – 2018-02-12 (×3): 237 mL via ORAL

## 2018-02-11 NOTE — Tx Team (Signed)
Interdisciplinary Treatment and Diagnostic Plan Update  02/11/2018 Time of Session:  Francisco Cross MRN: [id number]  Principal Diagnosis: <principal problem not specified>  Secondary Diagnoses: Active Problems:   Psychosis ([name])   Current Medications:  Current Facility-Administered Medications  Medication Dose Route Frequency Provider Last Rate Last Dose  . acetaminophen (TYLENOL) tablet 650 mg  650 mg Oral Q6H PRN [name], NP      . alum & mag hydroxide-simeth (MAALOX/MYLANTA) 200-200-20 MG/5ML suspension 30 mL  30 mL Oral Q4H PRN [name], NP      . feeding supplement (ENSURE ENLIVE) (ENSURE ENLIVE) liquid 237 mL  237 mL Oral TID BM [name], MD   237 mL at 02/11/18 1050  . LORazepam (ATIVAN) tablet 1 mg  1 mg Oral Q8H PRN [name], [name], MD      . magnesium hydroxide (MILK OF MAGNESIA) suspension 30 mL  30 mL Oral Daily PRN [name], NP      . nicotine (NICODERM CQ - dosed in mg/24 hours) patch 14 mg  14 mg Transdermal Daily [name], MD   14 mg at 02/11/18 0735  . sertraline (ZOLOFT) tablet 50 mg  50 mg Oral Daily [name], [name], MD      . traZODone (DESYREL) tablet 50 mg  50 mg Oral QHS PRN [name], NP       PTA Medications: Medications Prior to Admission  Medication Sig Dispense Refill Last Dose  . albuterol (PROVENTIL HFA;VENTOLIN HFA) 108 (90 Base) MCG/ACT inhaler Inhale 1-2 puffs into the lungs every 6 (six) hours as needed for wheezing or shortness of breath. 1 Inhaler 0 02/09/2018 at Unknown time  . [EXPIRED] benzonatate (TESSALON) 100 MG capsule Take 1 capsule (100 mg total) by mouth every 8 (eight) hours for 5 days. 15 capsule 0 02/09/2018 at Unknown time  . potassium chloride SA (K-DUR,KLOR-CON) 20 MEQ tablet Take 1 tablet (20 mEq total) by mouth 2 (two) times daily. 3 tablet 0   . promethazine (PHENERGAN) 25 MG tablet Take 1 tablet (25 mg total) by mouth every 6 (six) hours as needed for nausea  or vomiting. 30 tablet 0     Patient Stressors: Educational concerns Financial difficulties Occupational concerns Substance abuse Traumatic event  Patient Strengths: Ability for insight Average or above average intelligence Capable of independent living Agricultural engineer for treatment/growth Supportive family/friends  Treatment Modalities: Medication Management, Group therapy, Case management,  1 to 1 session with clinician, Psychoeducation, Recreational therapy.   Physician Treatment Plan for Primary Diagnosis: <principal problem not specified> Long Term Goal(s): Improvement in symptoms so as ready for discharge Improvement in symptoms so as ready for discharge   Short Term Goals: Ability to identify changes in lifestyle to reduce recurrence of condition will improve Ability to maintain clinical measurements within normal limits will improve Ability to identify changes in lifestyle to reduce recurrence of condition will improve Ability to verbalize feelings will improve Ability to disclose and discuss suicidal ideas Ability to demonstrate self-control will improve Ability to identify and develop effective coping behaviors will improve Ability to maintain clinical measurements within normal limits will improve  Medication Management: Evaluate patient's response, side effects, and tolerance of medication regimen.  Therapeutic Interventions: 1 to 1 sessions, Unit Group sessions and Medication administration.  Evaluation of Outcomes: Not Met  Physician Treatment Plan for Secondary Diagnosis: Active Problems:   Psychosis ([place])  Long Term Goal(s): Improvement in symptoms so as ready for  discharge Improvement in symptoms so as ready for discharge   Short Term Goals: Ability to identify changes in lifestyle to reduce recurrence of condition will improve Ability to maintain clinical measurements within normal limits will improve Ability to identify changes in  lifestyle to reduce recurrence of condition will improve Ability to verbalize feelings will improve Ability to disclose and discuss suicidal ideas Ability to demonstrate self-control will improve Ability to identify and develop effective coping behaviors will improve Ability to maintain clinical measurements within normal limits will improve     Medication Management: Evaluate patient's response, side effects, and tolerance of medication regimen.  Therapeutic Interventions: 1 to 1 sessions, Unit Group sessions and Medication administration.  Evaluation of Outcomes: Not Met   RN Treatment Plan for Primary Diagnosis: <principal problem not specified> Long Term Goal(s): Knowledge of disease and therapeutic regimen to maintain health will improve  Short Term Goals: Ability to demonstrate self-control, Ability to participate in decision making will improve, Ability to verbalize feelings will improve, Ability to disclose and discuss suicidal ideas, Ability to identify and develop effective coping behaviors will improve and Compliance with prescribed medications will improve  Medication Management: RN will administer medications as ordered by provider, will assess and evaluate patient's response and provide education to patient for prescribed medication. RN will report any adverse and/or side effects to prescribing provider.  Therapeutic Interventions: 1 on 1 counseling sessions, Psychoeducation, Medication administration, Evaluate responses to treatment, Monitor vital signs and CBGs as ordered, Perform/monitor CIWA, COWS, AIMS and Fall Risk screenings as ordered, Perform wound care treatments as ordered.  Evaluation of Outcomes: Not Met   LCSW Treatment Plan for Primary Diagnosis: <principal problem not specified> Long Term Goal(s): Safe transition to appropriate next level of care at discharge, Engage patient in therapeutic group addressing interpersonal concerns.  Short Term Goals: Engage  patient in aftercare planning with referrals and resources  Therapeutic Interventions: Assess for all discharge needs, 1 to 1 time with Social worker, Explore available resources and support systems, Assess for adequacy in community support network, Educate family and significant other(s) on suicide prevention, Complete Psychosocial Assessment, Interpersonal group therapy.  Evaluation of Outcomes: Not Met   Progress in Treatment: Attending groups: Yes. Participating in groups: Yes. Taking medication as prescribed: Yes. Toleration medication: Yes. Family/Significant other contact made: No, will contact:  if patient consents to collateral contacts Patient understands diagnosis: Yes. Discussing patient identified problems/goals with staff: Yes. Medical problems stabilized or resolved: Yes. Denies suicidal/homicidal ideation: Yes. Issues/concerns per patient self-inventory: No. Other:   New problem(s) identified: None   New Short Term/Long Term Goal(s): medication stabilization, elimination of SI thoughts, development of comprehensive mental wellness plan.    Patient Goals:  Help with anxiety, depression, suicidal thoughts and substance abuse  Discharge Plan or Barriers: CSW will continue to assess and follow for appropriate referrals and possible discharge planning.   Reason for Continuation of Hospitalization: Anxiety Depression Hallucinations Medication stabilization Suicidal ideation  Estimated Length of Stay: 3-5 days   Attendees: Patient: 02/11/2018 11:00 AM  Physician: Dr. Neita Garnet, MD 02/11/2018 11:00 AM  Nursing: Maudie Mercury.Jerilynn Mages, RN 02/11/2018 11:00 AM  RN Care Manager: 02/11/2018 11:00 AM  Social Worker: Radonna Ricker, [place] 02/11/2018 11:00 AM  Recreational Therapist:  02/11/2018 11:00 AM  Other: Zigmund Gottron, NP  02/11/2018 11:00 AM  Other:  02/11/2018 11:00 AM  Other: 02/11/2018 11:00 AM    Scribe for Treatment Team: Marylee Floras, [place] 02/11/2018 11:00 AM
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
## 2018-02-11 NOTE — Progress Notes (Signed)
Pt in room in bed with blanket over his head.  Pt makes no eye contact and answers questions with one word answers.  Pt declines to attend group.  Pt denies pain, discomfort, SI, HI and AVH and verbally contracts for safety.   Pt does not respond when offered his nicotine patch. Pt remains safe on unit.

## 2018-02-11 NOTE — BHH Counselor (Signed)
CSW attempted to complete PSA with the patient, however the patient is unable to participate at this time. The patient reported that he felt like he was dying and that he wanted staff to contact the police. MHT and RN staff attended to the patient and checked vitals.   CSW will attempt to complete assessment at a later time.   Baldo DaubJolan Williams, MSW, LCSWA Clinical Social Worker [facility]  Phone: [phone]

## 2018-02-11 NOTE — Progress Notes (Signed)
NUTRITION ASSESSMENT  Pt identified as at risk on the Malnutrition Screen Tool  INTERVENTION: - Continue Ensure Enlive but will increase to TID, each supplement provides 350 kcal and 20 grams of protein. - Continue to encourage PO intakes.   NUTRITION DIAGNOSIS: Unintentional weight loss related to sub-optimal intake as evidenced by pt report.   Goal: Pt to meet >/= 90% of their estimated nutrition needs.  Monitor:  PO intake  Assessment:  Patient admitted for intermittent hallucinations and intermittent SI and passive HI toward his mother. Per notes, patient had reported feeling like he was dying.   Per chart review, current weight is 114 lb and weight on 1/3 was 118 lb. This indicates 4 lb weight loss (3.4% body weight) in the past few days; significant for time frame.    18 y.o. male  Height: Ht Readings from Last 1 Encounters:  02/10/18 5\' 10"  (1.778 m) (56 %, Z= 0.14)*   * Growth percentiles are based on CDC (Boys, 2-20 Years) data.    Weight: Wt Readings from Last 1 Encounters:  02/10/18 51.7 kg (1 %, Z= -2.22)*   * Growth percentiles are based on CDC (Boys, 2-20 Years) data.    Weight Hx: Wt Readings from Last 10 Encounters:  02/10/18 51.7 kg (1 %, Z= -2.22)*  02/09/18 53.5 kg (3 %, Z= -1.94)*  02/09/18 53.5 kg (3 %, Z= -1.94)*  02/08/18 53.5 kg (3 %, Z= -1.94)*  10/07/14 56.3 kg (26 %, Z= -0.65)*  02/05/14 51.4 kg (18 %, Z= -0.91)*  08/08/13 57.9 kg (51 %, Z= 0.04)*   * Growth percentiles are based on CDC (Boys, 2-20 Years) data.    BMI:  Body mass index is 16.36 kg/m. Pt meets criteria for underweight based on current BMI.  Estimated Nutritional Needs: Kcal: 25-30 kcal/kg Protein: > 1 gram protein/kg Fluid: 1 ml/kcal  Diet Order:  Diet Order            Diet regular Room service appropriate? Yes; Fluid consistency: Thin  Diet effective now             Pt is also offered choice of unit snacks mid-morning and mid-afternoon.  Pt is eating  as desired.   Lab results and medications reviewed.     Trenton GammonJessica Ostheim, MS, RD, LDN, [facility] Inpatient Clinical Dietitian Pager # [phone] After hours/weekend pager # [phone]

## 2018-02-11 NOTE — Progress Notes (Signed)
Recreation Therapy Notes  Date: 1.6.20 Time: 0930 Location: 300 Hall Dayroom  Group Topic: Stress Management  Goal Area(s) Addresses:  Patient will engage in healthy stress management techniques. Patient will be able to identify positive stress management techniques.  Intervention: Stress Management  Activity : Meditation.  LRT introduced the stress management technique of meditation.  LRT played a meditation that focused on looking at each day as a new beginning.  Patients were to follow along as meditation was played in order to engage in activity.  Education:  Stress Management, Discharge Planning.   Education Outcome: Acknowledges Education  Clinical Observations/Feedback: Pt did not attend group.    Marjette Lindsay, LRT/CTRS         Lindsay, Marjette A 02/11/2018 10:52 AM 

## 2018-02-11 NOTE — Progress Notes (Signed)
Patient ID: Francisco Cross, male   DOB: 18-Apr-2000, 18 y.o.   MRN: [id number]   Patient is depressed and anxious. Keeps repeating "I am dying" Stated he is sleepy and shaky. Intermittent SI. Rates depression and hopelessness a 3. Poor appetite, good sleep. Given Ativan for anxiety. Pushed fluids. Monitored closely for safety. Given encouragement. Will continue to monitor for safety.

## 2018-02-11 NOTE — [facility] Group Notes (Signed)
LCSW Group Therapy Note 02/11/2018 11:33 AM  Type of Therapy and Topic: Group Therapy: Overcoming Obstacles  Participation Level: Did Not Attend  Description of Group:  In this group patients will be encouraged to explore what they see as obstacles to their own wellness and recovery. They will be guided to discuss their thoughts, feelings, and behaviors related to these obstacles. The group will process together ways to cope with barriers, with attention given to specific choices patients can make. Each patient will be challenged to identify changes they are motivated to make in order to overcome their obstacles. This group will be process-oriented, with patients participating in exploration of their own experiences as well as giving and receiving support and challenge from other group members.  Therapeutic Goals: 1. Patient will identify personal and current obstacles as they relate to admission. 2. Patient will identify barriers that currently interfere with their wellness or overcoming obstacles.  3. Patient will identify feelings, thought process and behaviors related to these barriers. 4. Patient will identify two changes they are willing to make to overcome these obstacles:   Summary of Patient Progress  Invited, chose not to attend.    Therapeutic Modalities:  Cognitive Behavioral Therapy Solution Focused Therapy Motivational Interviewing Relapse Prevention Therapy   Alcario Drought Clinical Social Worker
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
## 2018-02-11 NOTE — Progress Notes (Signed)
Pt has s;ept for 14-15 hours.  Pt wakes with mild HA.  Pt asking if it is life or death for him.  Pt offered support and reassurance but continues to worry.  Pt sts he tastes blood in his mouth.  No blood present and non in cup he spits into. Pt does not want to go to bfast but is encouraged and walked down to cafeteria. Pt has try and is sitting at table.

## 2018-02-11 NOTE — BHH Suicide Risk Assessment (Signed)
[facility] Admission Suicide Risk Assessment   Nursing information obtained from:  Patient Demographic factors:  Male, Adolescent or young adult, Low socioeconomic status Current Mental Status:  Suicidal ideation indicated by patient Loss Factors:  Financial problems / change in socioeconomic status Historical Factors:  Family history of suicide Risk Reduction Factors:  Living with another person, especially a relative, Sense of responsibility to family  Total Time spent with patient: 45 minutes Principal Problem: Panic Disorder, MDD Diagnosis:  Active Problems:   Psychosis (HCC)  Subjective Data:   Continued Clinical Symptoms:  Alcohol Use Disorder Identification Test Final Score (AUDIT): 28 The "Alcohol Use Disorders Identification Test", Guidelines for Use in Primary Care, Second Edition.  World Science writer [facility]). Score between 0-7:  no or low risk or alcohol related problems. Score between 8-15:  moderate risk of alcohol related problems. Score between 16-19:  high risk of alcohol related problems. Score 20 or above:  warrants further diagnostic evaluation for alcohol dependence and treatment.   CLINICAL FACTORS:  18 year old male, presented to ED with complaints of intermittent palpitations, increased anxiety, recent nausea,vomiting. (+) recent visits to ED for similar complaints .  Patient currently attributes symptoms to recent intake of an Oxycontin tablet ( x1)- denies opiate , BZD, or alcohol abuse . Does endorse regular/daily cannabis abuse, and reported nausea, vomiting could be related to cannabis .  In addition to anxiety reports increased depression which he attributes to anxiety. Denies SI at this time. Currently presents anxious, with panic symptoms, somatically focused . Reports death of two friends recently as a potential triggering event .   Psychiatric Specialty Exam: Physical Exam  ROS  Blood pressure 123/87, pulse (!) 130, temperature 99 F (37.2 C),  temperature source Oral, resp. rate 18, height 5\' 10"  (1.778 m), weight 51.7 kg, SpO2 100 %.Body mass index is 16.36 kg/m.  See admit note MSE    COGNITIVE FEATURES THAT CONTRIBUTE TO RISK:  Closed-mindedness and Loss of executive function    SUICIDE RISK:   Moderate:  Frequent suicidal ideation with limited intensity, and duration, some specificity in terms of plans, no associated intent, good self-control, limited dysphoria/symptomatology, some risk factors present, and identifiable protective factors, including available and accessible social support.  PLAN OF CARE: Patient will be admitted to inpatient psychiatric unit for stabilization and safety. Will provide and encourage milieu participation. Provide medication management and maked adjustments as needed.  Will follow daily.    I certify that inpatient services furnished can reasonably be expected to improve the patient's condition.   Craige Cotta, MD 02/11/2018, 9:49 AM

## 2018-02-12 LAB — TSH: TSH: 0.554 u[IU]/mL (ref 0.350–4.500)

## 2018-02-12 MED ORDER — ZIPRASIDONE MESYLATE 20 MG IM SOLR: 20.0000 mg | INTRAMUSCULAR | Status: DC | PRN

## 2018-02-12 MED ORDER — LORAZEPAM 0.5 MG PO TABS
ORAL_TABLET | ORAL | Status: AC
  Filled 2018-02-12: qty 1

## 2018-02-12 MED ORDER — OLANZAPINE 5 MG PO TBDP: 5.0000 mg | ORAL_TABLET | Freq: Three times a day (TID) | ORAL | Status: DC | PRN

## 2018-02-12 MED ORDER — QUETIAPINE FUMARATE 25 MG PO TABS: 25.0000 mg | ORAL_TABLET | Freq: Three times a day (TID) | ORAL | Status: DC

## 2018-02-12 MED ORDER — LORAZEPAM 0.5 MG PO TABS
0.5000 mg | ORAL_TABLET | Freq: Four times a day (QID) | ORAL | Status: DC | PRN
  Administered 2018-02-12: 0.5 mg via ORAL

## 2018-02-12 MED ORDER — HYDROXYZINE HCL 25 MG PO TABS
25.0000 mg | ORAL_TABLET | Freq: Four times a day (QID) | ORAL | Status: DC | PRN
  Administered 2018-02-13: 25 mg via ORAL
  Filled 2018-02-12: qty 1
  Filled 2018-02-12: qty 10

## 2018-02-12 NOTE — Progress Notes (Signed)
Pt presents with an flat affect and anxious mood. Pt noted to be somatic constantly throughout the day. Pt fixated on medications causing him to have increased anxiety, a fast heart beat and heart problems. Pt observed pacing all morning from his room, to the nurses station and to the doctor's office. Pt repeatedly states that the medications are causing him to feel high and makes his heart beat faster. Pt have been seen several times throughout the day by the MD. Pt v/s obtained and an EKG performed. Pt redirected as needed for somatic complaints.   Medications administered as ordered per MD. Verbal support provided. Pt encouraged to attend groups. 15 minute checks performed for safety.   Pt receptive to tx.

## 2018-02-12 NOTE — Progress Notes (Signed)
D: Pt passive SI- contracts for safety.  denies HI/AVH. Pt is pleasant and cooperative.pt presented very anxious and paranoid this evening. Pt too 2 mg of Ativan over the course of the day. Pt complained of how he was feeling, pt was informed that the medication may have been too strong for him . Pt was encouraged to lay down and try to go to sleep and he should start feeling better. Pt continued to wake up at random times, paranoid and feeling anxious, but every time he woke up he stated he was feeling a little better.  A: Pt was offered support and encouragement. Pt was given scheduled medications. Pt was encourage to attend groups. Q 15 minute checks were done for safety.  R:  safety maintained on unit.

## 2018-02-12 NOTE — Progress Notes (Addendum)
B[facility] Group Notes:  (Nursing/MHT/Case Management/Adjunct)  Date:  02/12/2018  Time:  1515  Type of Therapy:  Nurse Education  Participation Level:  Did not attend.  

## 2018-02-12 NOTE — Progress Notes (Addendum)
[facility] MD Progress Note  02/12/2018 1:18 PM Francisco Cross  MRN:  [id number] Subjective: Patient reports feeling better and closer to his baseline but remains anxious, somatically focused. Denies suicidal ideations. Reports some sedation from medication regimen. Objective: I have discussed case with treatment team and have met with patient.  18 year old male, presented to ED with complaints of intermittent palpitations, increased anxiety, recent nausea,vomiting. (+) recent visits to ED for similar complaints .  Patient currently attributes symptoms to recent intake of an Oxycontin tablet ( x1)- denies opiate , BZD, or alcohol abuse . Does endorse regular/daily cannabis abuse, and reported nausea, vomiting could be related to cannabis .  In addition to anxiety reports increased depression which he attributes to anxiety. Denies SI at this time. Currently presents anxious, with panic symptoms, somatically focused . Reports death of two friends recently as a potential triggering event . Compared to admission patient has improved partially.  He does continue to present intermittently anxious, frequently seeking reassurance from Probation officer and nursing staff.  He does state he feels more his usual self and as he improves he is more focused on being discharged soon.  He states he was visited by his grandmother with whom he lives yesterday and with his expressed consent I spoke with her.  She corroborates that he does seem improved, states that his somatic preoccupations are of recent onset. Patient describes vague side effects such as feeling sedated although appears fully alert and attentive, he also reports feeling "like a zombie".  He did clearly improve following on Ativan as needed yesterday, with significant reduction in anxiety although he states he took another Ativan later in the evening and it made him sedated. He tends to ruminate about medications and states he does not "like the Zoloft" although cannot  clearly describe why other than concerns for potential side effects. I have provided reassurance and psychoeducation regarding medications and nature of treatment plan.  We have also reviewed rationale to avoid long-term management with Ativan due to its abuse potential.  He does express interest in Vistaril PRNs for anxiety as needed. Anxious on unit but no overt agitated or disruptive behaviors.  Denies suicidal ideations. 02/12/18 EKG - Sinus Tachycardia , HR 106.  Principal Problem: Panic Disorder Diagnosis: Active Problems:   Psychosis ([place])  Total Time spent with patient: 20 minutes  Past Psychiatric History:   Past Medical History:  Past Medical History:  Diagnosis Date  . Anxiety   . Asthma   . Depression   . Eczema    History reviewed. No pertinent surgical history. Family History:  Family History  Problem Relation Age of Onset  . Healthy Mother   . Healthy Father    Family Psychiatric  History:  Social History:  Social History   Substance and Sexual Activity  Alcohol Use Yes  . Alcohol/week: 3.0 standard drinks  . Types: 3 Shots of liquor per week   Comment: 2-3 cups liquor every day     Social History   Substance and Sexual Activity  Drug Use Yes  . Frequency: 7.0 times per week  . Types: Marijuana, Oxycodone   Comment: THC 2-3          times day    Social History   Socioeconomic History  . Marital status: Single    Spouse name: Not on file  . Number of children: Not on file  . Years of education: 37  . Highest education level: 11th grade  Occupational History  .  Not on file  Social Needs  . Financial resource strain: Somewhat hard  . Food insecurity:    Worry: Sometimes true    Inability: Sometimes true  . Transportation needs:    Medical: No    Non-medical: No  Tobacco Use  . Smoking status: Former Smoker    Packs/day: 0.25    Years: 10.00    Pack years: 2.50    Last attempt to quit: 02/10/2018  . Smokeless tobacco: Never Used  Substance  and Sexual Activity  . Alcohol use: Yes    Alcohol/week: 3.0 standard drinks    Types: 3 Shots of liquor per week    Comment: 2-3 cups liquor every day  . Drug use: Yes    Frequency: 7.0 times per week    Types: Marijuana, Oxycodone    Comment: THC 2-3          times day  . Sexual activity: Yes    Birth control/protection: None  Lifestyle  . Physical activity:    Days per week: 3 days    Minutes per session: 30 min  . Stress: Very much  Relationships  . Social connections:    Talks on phone: More than three times a week    Gets together: More than three times a week    Attends religious service: Never    Active member of club or organization: No    Attends meetings of clubs or organizations: Never    Relationship status: Never married  Other Topics Concern  . Not on file  Social History Narrative  . Not on file   Additional Social History:    Pain Medications: see MAR Prescriptions: see MAR Over the Counter: see MAR History of alcohol / drug use?: Yes Longest period of sobriety (when/how long): unsure Negative Consequences of Use: Financial, Work / School Withdrawal Symptoms: Other (Comment)(anxiety, depression) Name of Substance 1: THC 1 - Age of First Use: 18 yrs old 1 - Amount (size/oz): blunts 2-3 blunts every day 1 - Frequency: daily 1 - Duration: daily 1 - Last Use / Amount: unsure    Sleep: Fair  Appetite:  Improving  Current Medications: Current Facility-Administered Medications  Medication Dose Route Frequency Provider Last Rate Last Dose  . acetaminophen (TYLENOL) tablet 650 mg  650 mg Oral Q6H PRN [name], NP      . alum & mag hydroxide-simeth (MAALOX/MYLANTA) 200-200-20 MG/5ML suspension 30 mL  30 mL Oral Q4H PRN [name], NP      . feeding supplement (ENSURE ENLIVE) (ENSURE ENLIVE) liquid 237 mL  237 mL Oral TID BM [name], MD   237 mL at 02/11/18 1446  . LORazepam (ATIVAN) tablet 0.5 mg  0.5 mg Oral Q6H PRN  [name], Myer Peer, MD   0.5 mg at 02/12/18 1133  . magnesium hydroxide (MILK OF MAGNESIA) suspension 30 mL  30 mL Oral Daily PRN [name], NP      . sertraline (ZOLOFT) tablet 50 mg  50 mg Oral Daily [name], Myer Peer, MD   50 mg at 02/12/18 0957  . traZODone (DESYREL) tablet 50 mg  50 mg Oral QHS PRN [name], NP   50 mg at 02/12/18 0211    Lab Results:  Results for orders placed or performed during the hospital encounter of 02/10/18 (from the past 48 hour(s))  Glucose, capillary     Status: Abnormal   Collection Time: 02/11/18  9:11 AM  Result Value Ref Range  Glucose-Capillary 102 (H) 70 - 99 mg/dL   Comment 1 Document in Chart     Blood Alcohol level:  Lab Results  Component Value Date   ETH <10 96/75/9163    Metabolic Disorder Labs: No results found for: HGBA1C, MPG No results found for: PROLACTIN No results found for: CHOL, TRIG, HDL, CHOLHDL, VLDL, LDLCALC  Physical Findings: AIMS: Facial and Oral Movements Muscles of Facial Expression: None, normal Lips and Perioral Area: None, normal Jaw: None, normal Tongue: None, normal,Extremity Movements Upper (arms, wrists, hands, fingers): None, normal Lower (legs, knees, ankles, toes): None, normal, Trunk Movements Neck, shoulders, hips: None, normal, Overall Severity Severity of abnormal movements (highest score from questions above): None, normal Incapacitation due to abnormal movements: None, normal Patient's awareness of abnormal movements (rate only patient's report): No Awareness, Dental Status Current problems with teeth and/or dentures?: No Does patient usually wear dentures?: No  CIWA:  CIWA-Ar Total: 2 COWS:  COWS Total Score: 4  Musculoskeletal: Strength & Muscle Tone: within normal limits Gait & Station: normal Patient leans: N/A  Psychiatric Specialty Exam: Physical Exam  ROS no headache, no chest pain, no shortness of breath, reports persistent palpitations, reports some  nausea, no rash  Blood pressure 130/87, pulse (!) 136, temperature 97.8 F (36.6 C), temperature source Oral, resp. rate 18, height '5\' 10"'  (1.778 m), weight 51.7 kg, SpO2 100 %.Body mass index is 16.36 kg/m.  Repeat pulse- manual 106.   General Appearance: Fairly Groomed  Eye Contact:  Good  Speech:  Normal Rate  Volume:  Normal  Mood:  reportys feeling better than on admission, remains anxious  Affect:  Congruent and anxious  Thought Process:  Linear and Descriptions of Associations: Intact  Orientation:  Full (Time, Place, and Person)  Thought Content:  denies hallucinations, no overt delusions, remains somatically focused   Suicidal Thoughts:  No denies suicidal or self-injurious ideations, contracts for safety on unit, denies any violent or homicidal ideations  Homicidal Thoughts:  No  Memory:  Recent and remote grossly intact  Judgement:  Fair  Insight:  Fair  Psychomotor Activity:  Some pacing on unit during episodes of increased anxiety  Concentration:  Concentration: Fair and Attention Span: Fair  Recall:  Good  Fund of Knowledge:  Good  Language:  Good  Akathisia:  Negative  Handed:  Right  AIMS (if indicated):     Assets:  Desire for Improvement Resilience  ADL's:  Intact  Cognition:  WNL  Sleep:  Number of Hours: 5   Assessment -  18 year old male, presented to ED with complaints of intermittent palpitations, increased anxiety, recent nausea,vomiting. (+) recent visits to ED for similar complaints .  Patient currently attributes symptoms to recent intake of an Oxycontin tablet ( x1)- denies opiate , BZD, or alcohol abuse . Does endorse regular/daily cannabis abuse, and reported nausea, vomiting could be related to cannabis .  Patient is presenting with partial improvement compared to admission and as he improves he is becoming more future oriented and hopeful for discharge soon.  He does remain anxious and somatically focused.  He is preoccupations now include concerns  about medications, and expresses significant anxiety about possible side effects and about feeling subjectively sedated.  His anxiety did improve significantly/temporarily with Ativan, which did cause him to feel somewhat sedated.  Currently presents alert, attentive and fully oriented.  We reviewed medication options at length.  He wants to discontinue Zoloft as he worries about side effects.  We discussed other  SSRI options and/or B Blocker to address symptoms and anxiety. Reluctant to start one of these medications at this time.   He does express interest in Vistaril PRNs for anxiety, as he states other patients have told them it has been effective for them. We reviewed side effects.  Of note he did not get the blood drawn this morning (a TSH has been ordered) I have reviewed importance of said testing and he states he will have it done this evening.  Treatment Plan Summary: Daily contact with patient to assess and evaluate symptoms and progress in treatment, Medication management, Plan Inpatient treatment and Medications as below Encourage group and milieu participation to work on coping skills and symptom reduction Encourage sobriety and abstinence Discontinue Zoloft-see above Discontinue Ativan PRN-see above Start Vistaril 25 mg every 6 hours PRN for anxiety We have discussed starting another SSRI, patient ambivalent at this time but states he will consider option Treatment team working on disposition planning TSH ordered/pending at this time. Jenne Campus, MD 02/12/2018, 1:18 PM   Addendum 1/7 6,15 PM  Patient reports feeling better overall, less anxious. Vitals improved - currently 136/84, pulse 102.  He has been able to use deep breathing and relaxation techniques to decrease anxiety. He is requesting to be discharged soon.  He does remain somatically focused and now  reports blurry vision which he attributes to Ativan ( last took 0.5 mgrs this AM) ,and requests going to ED " to have  my eyes checked out". Patient examined along with RN, able to correctly read printed words and to correctly identify correct finger count at 2-3 yards with both L and R eyes,  without difficulty.  Responded only partially to reassurance and becomes more insistent and irritable , but behavior remained in control. At his request medications( Ativan PRNs , Zoloft, Trazodone) have been discontinued , as he is concerned about side effects. Does agree to Vistaril PRNs . Will add agitation protocol to address acute agitation if needed .   Gabriel Earing MD

## 2018-02-12 NOTE — [facility] Group Notes (Signed)
Pt was invited but did not attend orientation group facilitated by MHT Macdilla W.  

## 2018-02-12 NOTE — [facility] Suicide Risk Assessment (Signed)
[facility] INPATIENT:  Family/Significant Other Suicide Prevention Education  Suicide Prevention Education:  Education Completed; grandmother, Leticia PennaDonna Chambliss [phone])  has been identified by the patient as the family member/significant other with whom the patient will be residing, and identified as the person(s) who will aid the patient in the event of a mental health crisis (suicidal ideations/suicide attempt).  With written consent from the patient, the family member/significant other has been provided the following suicide prevention education, prior to the and/or following the discharge of the patient.  The suicide prevention education provided includes the following:  Suicide risk factors  Suicide prevention and interventions  National Suicide Hotline telephone number  [facility] assessment telephone number  [facility] Emergency Assistance 911  [facility] and/or Residential Mobile Crisis [facility] telephone number  Request made of family/significant other to:  Remove weapons (e.g., guns, rifles, knives), all items previously/currently identified as safety concern.    Remove drugs/medications (over-the-counter, prescriptions, illicit drugs), all items previously/currently identified as a safety concern.  The family member/significant other verbalizes understanding of the suicide prevention education information provided.The family member/significant other agrees to remove the items of safety concern listed above.  Grandmother's primary concern is medication management and resolving negative side-effects such as feelings of dizziness and panic. Grandmother also wants to make sure patient is set up with outpatient follow up (CSW confirms appointments for 01/15) and that the patient's medical records reflect that Dr.Harris is the patient's primary care doctor.  Darreld McleanCharlotte C Hoy 02/12/2018, 1:05 PM

## 2018-02-13 DIAGNOSIS — F332 Major depressive disorder, recurrent severe without psychotic features: Secondary | ICD-10-CM

## 2018-02-13 DIAGNOSIS — F410 Panic disorder [episodic paroxysmal anxiety] without agoraphobia: Secondary | ICD-10-CM

## 2018-02-13 MED ORDER — HYDROXYZINE HCL 25 MG PO TABS: 25.0000 mg | ORAL_TABLET | Freq: Four times a day (QID) | ORAL | 0 refills | Status: DC | PRN

## 2018-02-13 NOTE — Progress Notes (Signed)
Recreation Therapy Notes  Date: 1.8.20 Time: 0930 Location: 300 Hall Dayroom  Group Topic: Stress Management  Goal Area(s) Addresses:  Patient will identify benefits of stress management. Patient will identify stress management techniques. Patient will identify benefits of using stress management post d/c.   Intervention: Stress Management  Activity :  Guided Imagery.  LRT introduced the stress management technique of guided imagery.  LRT read a script that took patients on a journey through a wildlife sanctuary.  Patients were to follow along as script was read to engage in activity.  Education:  Stress Management, Discharge Planning.   Education Outcome: Acknowledges Education  Clinical Observations/Feedback:  Pt did not attend group.    Marjette Lindsay, LRT/CTRS         Lindsay, Marjette A 02/13/2018 10:50 AM 

## 2018-02-13 NOTE — [facility] Suicide Risk Assessment (Addendum)
[facility]   Principal Problem:Anxiety, Panic Attacks Discharge Diagnoses: As above  Total Time spent with patient: 30 minutes  Musculoskeletal: Strength & Muscle Tone: within normal limits Gait & Station: normal Patient leans: N/A  Psychiatric Specialty Exam: ROS at this time denies headache, chest pain and currently does not describe subjective sense of palpitations or racing thoughts . Denies nausea or vomiting . Today denies blurry vision, which he had been concerned about yesterday  Blood pressure 123/82, pulse (!) 102, temperature 97.8 F (36.6 C), temperature source Oral, resp. rate 18, height 5\' 10"  (1.778 m), weight 51.7 kg, SpO2 100 %.Body mass index is 16.36 kg/m.  General Appearance: Well Groomed  Eye Contact::  Good  Speech:  Normal Rate409  Volume:  Normal  Mood:  reports " today I am feeling good", describes mood as 10/10 with 10 being best   Affect:  more reactive today, presents less anxious today  Thought Process:  Linear and Descriptions of Associations: Circumstantial  Orientation:  Other:  fully alert and attentive  Thought Content:  no hallucinations, no delusions , not internally preoccupied   Suicidal Thoughts:  No denies suicidal or self injurious ideations , at this time denies homicidal or violent ideations   Homicidal Thoughts:  No  Memory:  recent and remote grossly intact   Judgement:  Fair, improving  Insight:  Fair  Psychomotor Activity:  no current psychomotor agitation   Concentration:  Good  Recall:  Good  Fund of Knowledge:Good  Language: Good  Akathisia:  Negative  Handed:  Right  AIMS (if indicated):     Assets:  Communication Skills Desire for Improvement Resilience  Sleep:  Number of Hours: 3  Cognition: WNL  ADL's:  Intact   Mental Status Per Nursing Assessment::   On Admission:  Suicidal ideation indicated by patient  Demographic Factors:  18 year old single male, lives with his grandmother, has  one child who lives with mother  Loss Factors: Recent death of two friends, whom he states were murdered  Historical Factors: No prior psychiatric admissions , no prior suicide attempts, reports anxiety has been chronic but " never as bad" as he has experienced it recently.  Risk Reduction Factors:   Responsible for children under 20 years of age, Sense of responsibility to family, Living with another person, especially a relative and Positive coping skills or problem solving skills  Continued Clinical Symptoms:  At this time patient is alert, attentive, presents well groomed, calmer this morning .Mood is currently euthymic, with a more reactive affect and today does not present irritable. No thought disorder is noted. Denies suicidal ideations, denies homicidal or violent ideations towards anyone. Denies hallucinations. No delusions expressed, does not present paranoid or guarded.  He presents less somatically focused this morning and is no longer complaining of palpitations or blurry vision, which he had been concerned about yesterday. He states Vistaril PRN did help anxiety without perceived side effects. He has expressed reluctance to take an SSRI or other standing psychiatric medication due to concern about possible side effects or intolerance to medication. TSH WNL Serial EKGs have been done to monitor tachycardia: sinus tachycardia. EKG done today prior to discharge . HR normalized to 98. NSR.   On unit has required reassurance , support from staff regarding somatic concerns but today seems significantly less focused on these issues and presents future oriented at this time. States he plans to get his GED and is hoping to become an [employer] fundraiser  in the future . States he feels deep breathing helps anxiety, as well as music/rapping .  With his express consent I have spoken with his grandmother who has visited and who corroborated improvement and agrees with discharge .      Cognitive Features That  Contribute To Risk:  No gross cognitive deficits noted upon discharge. Is alert , attentive, and oriented x 3   Suicide Risk:  Mild:  Suicidal ideation of limited frequency, intensity, duration, and specificity.  There are no identifiable plans, no associated intent, mild dysphoria and related symptoms, good self-control (both objective and subjective assessment), few other risk factors, and identifiable protective factors, including available and accessible social support.  Follow-up Information    [facility]. Go on 02/20/2018.   Specialty:  Behavioral Health Why:  Your hospital follow up appointment is Wednesday, 02/20/18 at 8:00a.  Please bring: photo ID, proof of insurance, social security card, and any discharge paperwork from this hospitalization.  Contact information: [address] [place] [address] [phone]           Plan Of Care/Follow-up recommendations:  Activity:  as tolerated  Diet:  regular Tests:  NA Other:  See below Patient is expressing readiness for discharge- there are no current grounds for involuntary commitment and is leaving unit in good spirits. Plans to return home, grandmother will pick him up later today. Patient also referred to [facility] in order to establish care with PCP for medical monitoring as needed .  Craige Cotta, MD 02/13/2018, 8:10 AM

## 2018-02-13 NOTE — Progress Notes (Signed)
Dar Note: Patient was up for most of this shift.  Presents with flat affect and paranoid behavior.  Came to the nursing station with multiple requests.  Offered prn to assist with sleep but refused.  Routine safety checks maintained every 15 minutes.  Patient is safe on the unit.  Fluid intake encouraged.

## 2018-02-13 NOTE — Progress Notes (Signed)
  [facility] Adult Case Management Discharge Plan :  Will you be returning to the same living situation after discharge:  Yes,  patient reports he is returning home with his grandmother At discharge, do you have transportation home?: Yes,  patient reports his grandmother is picking him up at discharge Do you have the ability to pay for your medications: Yes,  Medicaid  Release of information consent forms completed and in the chart;  Patient's signature needed at discharge.  Patient to Follow up at: Follow-up Information    [facility]. Go on 02/20/2018.   Specialty:  Behavioral Health Why:  Your hospital follow up appointment is Wednesday, 02/20/18 at 8:00a.  Please bring: photo ID, proof of insurance, social security card, and any discharge paperwork from this hospitalization.  Contact information: [address] [place] [address] [phone]           Next level of care provider has access to [facility] Link:yes  Safety Planning and Suicide Prevention discussed: Yes,  with the patient's grandmother  Have you used any form of tobacco in the last 30 days? (Cigarettes, Smokeless Tobacco, Cigars, and/or Pipes): Yes  Has patient been referred to the Quitline?: Patient refused referral  Patient has been referred for addiction treatment: Pt. refused referral  Maeola Sarah, LCSWA 02/13/2018, 9:27 AM

## 2018-02-13 NOTE — Discharge Summary (Addendum)
Physician Discharge Summary Note  Patient:  Francisco Cross is an 18 y.o., male MRN:  [id number] DOB:  Oct 18, 2000 Patient phone:  [phone] (home)  Patient address:   [address] [place] [address],  Total Time spent with patient: 15 minutes  Date of Admission:  02/10/2018 Date of Discharge: 02/13/2018  Reason for Admission:  Suicidal ideation  Principal Problem: Panic disorder Discharge Diagnoses: Principal Problem:   Panic disorder Active Problems:   Psychosis (HCC)   Severe episode of recurrent major depressive disorder, without psychotic features (HCC)   Past Psychiatric History: Per admission H&P: no history of prior psychiatric admissions . Denies history of suicide attempts, denies history of self cutting. Denies history of hallucinations . Reports history of anxiety " but never this bad".Denies prior history of panic attacks. Denies agoraphobia, denies social anxiety symptoms.   Past Medical History:  Past Medical History:  Diagnosis Date  . Anxiety   . Asthma   . Depression   . Eczema    History reviewed. No pertinent surgical history. Family History:  Family History  Problem Relation Age of Onset  . Healthy Mother   . Healthy Father    Family Psychiatric  History: Per admission H&P: reports there is a history of psychosis ( grandmother) but cannot specify further. No suicides in family. Denies history of alcohol or drug abuse in family.  Social History:  Social History   Substance and Sexual Activity  Alcohol Use Yes  . Alcohol/week: 3.0 standard drinks  . Types: 3 Shots of liquor per week   Comment: 2-3 cups liquor every day     Social History   Substance and Sexual Activity  Drug Use Yes  . Frequency: 7.0 times per week  . Types: Marijuana, Oxycodone   Comment: THC 2-3          times day    Social History   Socioeconomic History  . Marital status: Single    Spouse name: Not on file  . Number of children: Not on file  . Years of  education: 77  . Highest education level: 11th grade  Occupational History  . Not on file  Social Needs  . Financial resource strain: Somewhat hard  . Food insecurity:    Worry: Sometimes true    Inability: Sometimes true  . Transportation needs:    Medical: No    Non-medical: No  Tobacco Use  . Smoking status: Former Smoker    Packs/day: 0.25    Years: 10.00    Pack years: 2.50    Last attempt to quit: 02/10/2018  . Smokeless tobacco: Never Used  Substance and Sexual Activity  . Alcohol use: Yes    Alcohol/week: 3.0 standard drinks    Types: 3 Shots of liquor per week    Comment: 2-3 cups liquor every day  . Drug use: Yes    Frequency: 7.0 times per week    Types: Marijuana, Oxycodone    Comment: THC 2-3          times day  . Sexual activity: Yes    Birth control/protection: None  Lifestyle  . Physical activity:    Days per week: 3 days    Minutes per session: 30 min  . Stress: Very much  Relationships  . Social connections:    Talks on phone: More than three times a week    Gets together: More than three times a week    Attends religious service: Never    Active  member of club or organization: No    Attends meetings of clubs or organizations: Never    Relationship status: Never married  Other Topics Concern  . Not on file  Social History Narrative  . Not on file    Hospital Course:  Per admission H&P 02/11/2018: Patient was brought to ED by EMS for evaluation of nausea, vomiting, dizziness x 2 weeks, with reported significant weight loss of about 20 lobs. Patient had gone to ED on several occasions for similar complaints.  At present, patient presents  anxious, somatically focused and needy for reassurance, expressing concerns that he has los weight, feels tired even after a full night of sleep, and vague complaints of " not feeling well". Patient attributes onset of symptoms to taking an Oxycodone tablet (x 1) a few days ago, with the purpose of " getting high", but  states it resulted in increased anxiety, palpitations. He categorically denies any pattern of opiate abuse , and states above use was an isolated instance. He also denies alcohol or BZD abuse . He states he uses Cannabis daily.  Admission UDS positive for Cannabis, admission BAL negative. In addition to significant anxiety, as above, he also reports depression, neuro-vegetative symptoms of depression, although denies suicidal ideations. Attributes depression to anxiety. During session patient had episode of panic- feeling " clammy", lightheaded, palpitations, very anxious, with a subjective sense of " feeling like I am going to die". He calmed down partially /gradually with support and guided deep breathing. Vitals were done during session- 121/86, pulse 114 sitting, 123/87 ,pulse 130 standing , )2 sat 100 % at room air. CBG was also done - 102. 1/5 labs reviewed- BMP and CBC unremarkable .1/4 HIV test negative. 1/5 EKG NSR, HR 61, QTc 386. Reports recent death of two of his friends, who were killed 2-3 weeks ago, as a potentially triggering event .   Mr. Francisco Cross presented to the ED multiple times over several days prior to admission, with various somatic complaints (N/V, dizziness, palpitations). He expressed suicidal ideation in the ED and [facility]. During this hospitalization he continued to present with frequent somatic complaints, particularly heart palpitations. Multiple EKGs were done with no concerning findings. Patient was started on Zoloft and PRN Ativan but continued to be somatically focused; he blamed medications for reported symptoms (palpitations and anxiety) and refused the medications. [facility]. He improved with therapy.  He has shown decreased anxiety with improved mood and interaction and fewer somatic complaints. He denies any SI/HI/AVH and contracts for safety. He is discharging home with his grandmother. [facility] (see below). He is provided with prescription for PRN Vistaril upon discharge (see below).  Physical Findings: AIMS: Facial and Oral Movements Muscles of Facial Expression: None, normal Lips and Perioral Area: None, normal Jaw: None, normal Tongue: None, normal,Extremity Movements Upper (arms, wrists, hands, fingers): None, normal Lower (legs, knees, ankles, toes): None, normal, Trunk Movements Neck, shoulders, hips: None, normal, Overall Severity Severity of abnormal movements (highest score from questions above): None, normal Incapacitation due to abnormal movements: None, normal Patient's awareness of abnormal movements (rate only patient's report): No Awareness, Dental Status Current problems with teeth and/or dentures?: No Does patient usually wear dentures?: No  CIWA:  CIWA-Ar Total: 2 COWS:  COWS Total Score: 4  Musculoskeletal: Strength & Muscle Tone: within normal limits Gait & Station: normal Patient leans: N/A  Psychiatric Specialty Exam: Physical Exam  Nursing note and vitals reviewed. Constitutional: He is oriented to person, place, and time. He appears well-developed and well-nourished.  Cardiovascular: Normal rate.  Respiratory: Effort normal.  Neurological: He is alert and oriented to person, place, and time.    Review of Systems  Constitutional: Negative.   Respiratory: Negative.   Cardiovascular: Positive for palpitations (normal EKG). Negative for chest pain.  Neurological: Negative.   Psychiatric/Behavioral: Positive for depression (improving) and substance abuse (hx THC, opioids). Negative for hallucinations, memory loss and suicidal ideas. The patient is nervous/anxious (improving). The patient does not have insomnia.     Blood pressure 123/82, pulse (!) 102, temperature 97.8 F (36.6 C), temperature source Oral, resp. rate 18, height 5\' 10"  (1.778 m), weight 51.7 kg, SpO2 100 %.Body mass index is 16.36 kg/m.  See  MD's discharge SRA     Have you used any form of tobacco in the last 30 days? (Cigarettes, Smokeless Tobacco, Cigars, and/or Pipes): Yes  Has this patient used any form of tobacco in the last 30 days? (Cigarettes, Smokeless Tobacco, Cigars, and/or Pipes)  No  Blood Alcohol level:  Lab Results  Component Value Date   ETH <10 02/10/2018    Metabolic Disorder Labs:  No results found for: HGBA1C, MPG No results found for: PROLACTIN No results found for: CHOL, TRIG, HDL, CHOLHDL, VLDL, LDLCALC  See Psychiatric Specialty Exam and Suicide Risk Assessment completed by Attending Physician prior to discharge.  Discharge destination:  Home  Is patient on multiple antipsychotic therapies at discharge:  No   Has Patient had three or more failed trials of antipsychotic monotherapy by history:  No  Recommended Plan for Multiple Antipsychotic Therapies: NA  Discharge Instructions    Discharge instructions   Complete by:  As directed    Patient is instructed to take all prescribed medications as recommended. Report any side effects or adverse reactions to your outpatient psychiatrist. Patient is instructed to abstain from alcohol and illegal drugs while on prescription medications. In the event of worsening symptoms, patient is instructed to call the crisis hotline, 911, or go to the nearest emergency department for evaluation and treatment.     Allergies as of 02/13/2018      Reactions   Orange Fruit [citrus]    States orange and orange flavored things give him migraines.      Medication List    STOP taking these medications   albuterol 108 (90 Base) MCG/ACT inhaler Commonly known as:  PROVENTIL HFA;VENTOLIN HFA   benzonatate 100 MG capsule Commonly known as:  TESSALON   potassium chloride SA 20 MEQ tablet Commonly known as:  K-DUR,KLOR-CON   promethazine 25 MG tablet Commonly known as:  PHENERGAN     TAKE these medications     Indication  hydrOXYzine 25 MG tablet Commonly  known as:  ATARAX/VISTARIL Take 1 tablet (25 mg total) by mouth every 6 (six) hours as needed for anxiety.  Indication:  Feeling Anxious      Follow-up Information    [facility]. Go on 02/20/2018.   Specialty:  Behavioral Health Why:  Your hospital follow up appointment is Wednesday, 02/20/18 at 8:00a.  Please bring: photo ID, proof of insurance, social security card, and any discharge paperwork from this hospitalization.  Contact information: [address] [place] [address] [phone]        [facility]. Go on 02/18/2018.   Specialty:  Family Medicine Why:  Please attend your  appointment with Dr. Tiburcio PeaHarris on Monday, 1/13 at 12:00p.  Contact information: [address] [place] [id number] [phone]           Follow-up recommendations: Activity as tolerated. Diet as recommended by primary care physician. Keep all scheduled follow-up appointments as recommended.   Comments:   Patient is instructed to take all prescribed medications as recommended. Report any side effects or adverse reactions to your outpatient psychiatrist. Patient is instructed to abstain from alcohol and illegal drugs while on prescription medications. In the event of worsening symptoms, patient is instructed to call the crisis hotline, 911, or go to the nearest emergency department for evaluation and treatment.  Signed: Aldean BakerJanet E Sykes, NP 02/13/2018, 1:41 PM    ..Agree with NP Progress Note

## 2018-02-13 NOTE — Progress Notes (Signed)
Nursing discharge note: Patient discharged home per MD order.  Patient received all personal belongings from unit and locker.  Reviewed AVS/transition record with patient and he indicates understanding.  Patient will follow up with [facility] and [employer].  Patient denies any thoughts of self harm.  He left ambulatory with his mother.  Post note:  Patient extremely somatic.  Requested an EKG before his discharge.  Patient had 5 EKGs in his shadow chart.  Also his vital were taken and his pulse was a little elevated.  Patient has been anxious over his discharge.  He continuously knocked on the MD's door while MD was seeing other patients.  When a patient would exit, patient would enter MD's office.  He was asked numerous times by staff to stop this behavior.  This nurse attempted to get him to leave MD's office and he states, "Doc, tell her to leave."  He staff splits and asked that another staff member be present in MD's office (male staff whom he kept referring to as "Doc.").  Patient also told all of his peers on the hallway that he was given the wrong medication yesterday.  He states, "I jumped in front of another patient and she gave me adderall.  I explained to him about the scanning system and that it would be highly unlikely that anyone on the hall received adderall.  He insisted that he did.  Patient has been referred to [facility] for his somatic symptoms.  Patient's mother came to pick him up.  He denied any thoughts of self harm.

## 2018-02-13 NOTE — Plan of Care (Signed)
  Problem: Education: Goal: Knowledge of [facility] Education information/materials will improve Outcome: Completed/Met Goal: Emotional status will improve Outcome: Completed/Met Goal: Mental status will improve Outcome: Completed/Met Goal: Verbalization of understanding the information provided will improve Outcome: Completed/Met   Problem: Activity: Goal: Interest or engagement in activities will improve Outcome: Completed/Met Goal: Sleeping patterns will improve Outcome: Completed/Met   Problem: Education: Goal: Utilization of techniques to improve thought processes will improve Outcome: Completed/Met Goal: Knowledge of the prescribed therapeutic regimen will improve Outcome: Completed/Met   Problem: Activity: Goal: Interest or engagement in leisure activities will improve Outcome: Completed/Met Goal: Imbalance in normal sleep/wake cycle will improve Outcome: Completed/Met   Problem: Coping: Goal: Coping ability will improve Outcome: Completed/Met Goal: Will verbalize feelings Outcome: Completed/Met   Problem: Education: Goal: Ability to state activities that reduce stress will improve Outcome: Completed/Met   Problem: Coping: Goal: Ability to identify and develop effective coping behavior will improve Outcome: Completed/Met   Problem: Education: Goal: Ability to make informed decisions regarding treatment will improve Outcome: Completed/Met   Problem: Coping: Goal: Coping ability will improve Outcome: Completed/Met   Problem: Education: Goal: Knowledge of disease or condition will improve Outcome: Completed/Met Goal: Understanding of discharge needs will improve Outcome: Completed/Met   Problem: Health Behavior/Discharge Planning: Goal: Ability to identify changes in lifestyle to reduce recurrence of condition will improve Outcome: Completed/Met Goal: Identification of resources available to assist in meeting health care needs will  improve Outcome: Completed/Met

## 2018-02-14 ENCOUNTER — Emergency Department (HOSPITAL_COMMUNITY)
Admission: EM | Admit: 2018-02-14 | Discharge: 2018-02-14 | Disposition: A | Discharge status: Home / Self Care | Payer: Medicaid Other | Attending: Emergency Medicine | Admitting: Emergency Medicine

## 2018-02-14 ENCOUNTER — Encounter (HOSPITAL_COMMUNITY): Payer: Self-pay | Admitting: Emergency Medicine

## 2018-02-14 ENCOUNTER — Other Ambulatory Visit: Payer: Self-pay

## 2018-02-14 ENCOUNTER — Emergency Department (HOSPITAL_COMMUNITY)
Admission: EM | Admit: 2018-02-14 | Discharge: 2018-02-14 | Disposition: A | Discharge status: Home / Self Care | Payer: Medicaid Other | Source: Home / Self Care | Attending: Emergency Medicine | Admitting: Emergency Medicine

## 2018-02-14 DIAGNOSIS — L299 Pruritus, unspecified: Secondary | ICD-10-CM | POA: Diagnosis present

## 2018-02-14 DIAGNOSIS — F419 Anxiety disorder, unspecified: Secondary | ICD-10-CM

## 2018-02-14 DIAGNOSIS — J45909 Unspecified asthma, uncomplicated: Secondary | ICD-10-CM | POA: Insufficient documentation

## 2018-02-14 DIAGNOSIS — Z87891 Personal history of nicotine dependence: Secondary | ICD-10-CM | POA: Insufficient documentation

## 2018-02-14 DIAGNOSIS — G4700 Insomnia, unspecified: Secondary | ICD-10-CM

## 2018-02-14 DIAGNOSIS — Z79899 Other long term (current) drug therapy: Secondary | ICD-10-CM

## 2018-02-14 DIAGNOSIS — F329 Major depressive disorder, single episode, unspecified: Secondary | ICD-10-CM | POA: Diagnosis not present

## 2018-02-14 MED ORDER — TRAZODONE HCL 50 MG PO TABS: 50.0000 mg | ORAL_TABLET | Freq: Every day | ORAL | 0 refills | Status: AC

## 2018-02-14 MED ORDER — MELATONIN 1 MG PO TABS: 1.0000 mg | ORAL_TABLET | Freq: Every day | ORAL | 0 refills | Status: AC

## 2018-02-14 NOTE — ED Notes (Signed)
Pt very angry states no one is helping him state his body is falling apart attempted to discuss discharge pt became angry and requesting to leave discharge paper given

## 2018-02-14 NOTE — ED Provider Notes (Signed)
[facility]-EMERGENCY DEPT Provider Note   CSN: [id number] Arrival date & time: 02/14/18  2205     History   Chief Complaint Chief Complaint  Patient presents with  . Insomnia    HPI Francisco Cross is a 18 y.o. male.  HPI Patient presents to the emergency room for evaluation of insomnia.  Patient has been seen several times in the emergency room recently for various somatic complaints.  He has been in the ED now 9 times in the past 6 months and most of these visits have been since December.  Patient had various complaints including nausea, vomiting, dehydration.  He also complained of poor appetite over the several visits.  He was also complaining of palpitations.  Patient was seen in the emergency room on January 5 and at that time he was having symptoms concerning for depression and anxiety.  He was admitted to the [facility].Patient was admitted on January 5 and discharged on January 8.  Patient was started on Zoloft and as needed Ativan but he felt that those medications were contributing to his symptoms.  He was discharged with a prescription for hydroxyzine.  He was scheduled for outpatient therapy.  Since leaving the behavioral health hospital yesterday the patient came back to the emergency room this morning.  He was complaining insomnia and not sleeping well.  Patient was discharged with a prescription for melatonin.  Patient comes back this evening for the same complaints.  Patient states he still has not slept well and not sure he tried to sleep during the day.  Patient denies any SI or HI. Past Medical History:  Diagnosis Date  . Anxiety   . Asthma   . Depression   . Eczema     Patient Active Problem List   Diagnosis Date Noted  . Severe episode of recurrent major depressive disorder, without psychotic features (HCC)   . Panic disorder   . Psychosis (HCC) 02/10/2018    History reviewed. No pertinent surgical  history.      Home Medications    Prior to Admission medications   Medication Sig Start Date End Date Taking? Authorizing Provider  hydrOXYzine (ATARAX/VISTARIL) 25 MG tablet Take 1 tablet (25 mg total) by mouth every 6 (six) hours as needed for anxiety. 02/13/18   Aldean Baker, NP  Melatonin 1 MG TABS Take 1 tablet (1 mg total) by mouth daily. Before sleep 02/14/18 03/16/18  Virgina Norfolk, DO  traZODone (DESYREL) 50 MG tablet Take 1 tablet (50 mg total) by mouth at bedtime. 02/14/18   Linwood Dibbles, MD    Family History Family History  Problem Relation Age of Onset  . Healthy Mother   . Healthy Father     Social History Social History   Tobacco Use  . Smoking status: Former Smoker    Packs/day: 0.25    Years: 10.00    Pack years: 2.50    Last attempt to quit: 02/10/2018    Years since quitting: 0.0  . Smokeless tobacco: Never Used  Substance Use Topics  . Alcohol use: Yes    Alcohol/week: 3.0 standard drinks    Types: 3 Shots of liquor per week    Comment: 2-3 cups liquor every day  . Drug use: Yes    Frequency: 7.0 times per week    Types: Marijuana, Oxycodone    Comment: THC 2-3          times day  Allergies   Orange fruit [citrus]   Review of Systems Review of Systems  All other systems reviewed and are negative.    Physical Exam Updated Vital Signs BP 122/71 (BP Location: Right Arm)   Pulse 96   Temp 98.7 F (37.1 C) (Oral)   Resp 14   SpO2 98%   Physical Exam Vitals signs and nursing note reviewed.  Constitutional:      General: He is not in acute distress.    Appearance: He is well-developed.  HENT:     Head: Normocephalic and atraumatic.     Right Ear: External ear normal.     Left Ear: External ear normal.  Eyes:     General: No scleral icterus.       Right eye: No discharge.        Left eye: No discharge.     Conjunctiva/sclera: Conjunctivae normal.  Neck:     Musculoskeletal: Neck supple.     Trachea: No tracheal deviation.    Cardiovascular:     Rate and Rhythm: Normal rate and regular rhythm.  Pulmonary:     Effort: Pulmonary effort is normal. No respiratory distress.     Breath sounds: Normal breath sounds. No stridor. No wheezing or rales.  Abdominal:     General: Bowel sounds are normal. There is no distension.     Palpations: Abdomen is soft.     Tenderness: There is no abdominal tenderness. There is no guarding or rebound.  Musculoskeletal:        General: No tenderness.  Skin:    General: Skin is warm and dry.     Findings: No rash.  Neurological:     Mental Status: He is alert.     Cranial Nerves: No cranial nerve deficit (no facial droop, extraocular movements intact, no slurred speech).     Sensory: No sensory deficit.     Motor: No abnormal muscle tone or seizure activity.     Coordination: Coordination normal.  Psychiatric:        Mood and Affect: Affect is not blunt.        Speech: Speech is not delayed or slurred.        Behavior: Behavior is not aggressive or hyperactive.        Thought Content: Thought content does not include homicidal or suicidal ideation.      ED Treatments / Results   Procedures Procedures (including critical care time)  Medications Ordered in ED Medications - No data to display   Initial Impression / Assessment and Plan / ED Course  I have reviewed the triage vital signs and the nursing notes.  Pertinent labs & imaging results that were available during my care of the patient were reviewed by me and considered in my medical decision making (see chart for details).   Patient had a full set of laboratory tests just 4 days ago.  Those were all unremarkable.  Patient is complaining of insomnia.  He has fair amount of somatic complaints.  I think this is likely related to his psychiatric illness.  He is not suicidal homicidal.  I think it would be reasonable to have him try trazodone this may help with his anxiety as well as insomnia.  He appears stable to  follow-up with his outpatient mental health provider.  Final Clinical Impressions(s) / ED Diagnoses   Final diagnoses:  Insomnia, unspecified type  Anxiety    ED Discharge Orders  Ordered    traZODone (DESYREL) 50 MG tablet  Daily at bedtime     02/14/18 2239           Linwood DibblesKnapp, Jon, MD 02/14/18 2239

## 2018-02-14 NOTE — ED Notes (Signed)
MD at bedside. 

## 2018-02-14 NOTE — ED Provider Notes (Signed)
[facility]-EMERGENCY DEPT Provider Note   CSN: [id number] Arrival date & time: 02/14/18  0806     History   Chief Complaint Chief Complaint  Patient presents with  . Allergic Reaction    HPI Francisco Cross is a 18 y.o. male.  The history is provided by the patient.  Allergic Reaction  Presenting symptoms: itching   Presenting symptoms: no rash   Severity:  Mild Prior allergic episodes:  No prior episodes Context: medications   Relieved by:  Nothing Worsened by:  Nothing   Past Medical History:  Diagnosis Date  . Anxiety   . Asthma   . Depression   . Eczema     Patient Active Problem List   Diagnosis Date Noted  . Severe episode of recurrent major depressive disorder, without psychotic features (HCC)   . Panic disorder   . Psychosis (HCC) 02/10/2018    No past surgical history on file.      Home Medications    Prior to Admission medications   Medication Sig Start Date End Date Taking? Authorizing Provider  hydrOXYzine (ATARAX/VISTARIL) 25 MG tablet Take 1 tablet (25 mg total) by mouth every 6 (six) hours as needed for anxiety. 02/13/18   Aldean BakerSykes, Janet E, NP  Melatonin 1 MG TABS Take 1 tablet (1 mg total) by mouth daily. Before sleep 02/14/18 03/16/18  Virgina Norfolkuratolo, Adam, DO    Family History Family History  Problem Relation Age of Onset  . Healthy Mother   . Healthy Father     Social History Social History   Tobacco Use  . Smoking status: Former Smoker    Packs/day: 0.25    Years: 10.00    Pack years: 2.50    Last attempt to quit: 02/10/2018    Years since quitting: 0.0  . Smokeless tobacco: Never Used  Substance Use Topics  . Alcohol use: Yes    Alcohol/week: 3.0 standard drinks    Types: 3 Shots of liquor per week    Comment: 2-3 cups liquor every day  . Drug use: Yes    Frequency: 7.0 times per week    Types: Marijuana, Oxycodone    Comment: THC 2-3          times day     Allergies   Orange fruit [citrus]   Review  of Systems Review of Systems  Constitutional: Negative for chills and fever.  HENT: Negative for ear pain and sore throat.   Eyes: Negative for pain and visual disturbance.  Respiratory: Negative for cough and shortness of breath.   Cardiovascular: Negative for chest pain and palpitations.  Gastrointestinal: Negative for abdominal distention, abdominal pain, diarrhea, nausea and vomiting.  Genitourinary: Negative for dysuria and hematuria.  Musculoskeletal: Negative for arthralgias and back pain.  Skin: Positive for itching. Negative for color change and rash.  Neurological: Negative for seizures and syncope.  Psychiatric/Behavioral: The patient is nervous/anxious.   All other systems reviewed and are negative.    Physical Exam Updated Vital Signs  ED Triage Vitals  Enc Vitals Group     BP 02/14/18 0812 124/81     Pulse Rate 02/14/18 0812 90     Resp 02/14/18 0812 20     Temp 02/14/18 0812 97.7 F (36.5 C)     Temp Source 02/14/18 0812 Oral     SpO2 02/14/18 0812 100 %     Weight 02/14/18 0812 120 lb (54.4 kg)     Height 02/14/18 0812 5\' 10"  (1.778  m)     Head Circumference --      Peak Flow --      Pain Score 02/14/18 0818 0     Pain Loc --      Pain Edu? --      Excl. in GC? --     Physical Exam Vitals signs and nursing note reviewed.  Constitutional:      Appearance: He is well-developed.  HENT:     Head: Normocephalic and atraumatic.     Nose: Nose normal. No congestion or rhinorrhea.     Mouth/Throat:     Mouth: Mucous membranes are moist.     Pharynx: No oropharyngeal exudate or posterior oropharyngeal erythema.  Eyes:     Extraocular Movements: Extraocular movements intact.     Conjunctiva/sclera: Conjunctivae normal.     Pupils: Pupils are equal, round, and reactive to light.  Neck:     Musculoskeletal: Normal range of motion and neck supple.  Cardiovascular:     Rate and Rhythm: Normal rate and regular rhythm.     Pulses: Normal pulses.     Heart  sounds: Normal heart sounds. No murmur.  Pulmonary:     Effort: Pulmonary effort is normal. No respiratory distress.     Breath sounds: Normal breath sounds.  Abdominal:     Palpations: Abdomen is soft.     Tenderness: There is no abdominal tenderness.  Skin:    General: Skin is warm and dry.     Findings: No rash.  Neurological:     Mental Status: He is alert.  Psychiatric:        Mood and Affect: Mood is anxious.        Speech: Speech normal.        Behavior: Behavior normal.        Thought Content: Thought content normal.      ED Treatments / Results  Labs (all labs ordered are listed, but only abnormal results are displayed) Labs Reviewed - No data to display  EKG None  Radiology No results found.  Procedures Procedures (including critical care time)  Medications Ordered in ED Medications - No data to display   Initial Impression / Assessment and Plan / ED Course  I have reviewed the triage vital signs and the nursing notes.  Pertinent labs & imaging results that were available during my care of the patient were reviewed by me and considered in my medical decision making (see chart for details).     Francisco Cross is a 18 year old male with history of anxiety, depression who presents to the ED with concern for allergic reaction.  Patient with normal vitals.  No fever.  Patient recently started to Vistaril and feels like he is having an allergic reaction to it.  He states that he has been unable to sleep all night.  Has some tingling of his lips but there is no obvious signs of allergic reaction on exam.  No rash, no tongue swelling, lip swelling.  No GI symptoms.  No concern for anaphylaxis.  Patient appears anxious on exam.  Denies any suicidal homicidal ideation.  Overall exam is unremarkable and patient is overall well-appearing.  Gave patient reassurance that likely just suffering from anxiety versus medication side effect.  Recommend that he discontinue  Vistaril and follow-up with his primary care doctor.  Will prescribe melatonin to help with sleep.  Given return precautions and discharged from ED in good condition.  This chart was dictated using voice recognition  software.  Despite best efforts to proofread,  errors can occur which can change the documentation meaning.    Final Clinical Impressions(s) / ED Diagnoses   Final diagnoses:  Anxiety    ED Discharge Orders         Ordered    Melatonin 1 MG TABS  Daily     02/14/18 0836           Virgina Norfolk, DO 02/14/18 [phone]

## 2018-02-14 NOTE — Discharge Instructions (Addendum)
Follow-up with your mental health provider, try taking the trazodone to see if that helps with your sleep

## 2018-02-14 NOTE — ED Triage Notes (Signed)
Pt states he took hydroxyzine 25 mg last around 0830 pm 02/14/2018 and around 9 pm his lips got swollen and started having heartburn. Pt states he can't sleep and stomach is hard but no abdominal pain.

## 2018-02-14 NOTE — ED Triage Notes (Signed)
Patient recently seen here and discharged. Patient with complaints of insomnia. Patient also complaining of being afraid to sleep due to when he wakes up he has marks that were not there when he woke up. Patient states he just wants to sleep.

## 2018-02-14 NOTE — Progress Notes (Signed)
Patient ID: ULRIC USEY, male   DOB: [birth year], 18 y.o.   MRN: [id number] 02/14/2018 ( 11,30 AM) phone documentation. Patient contacted unit from home.  Requested to speak with Clinical research associate.  States he has had difficulty with insomnia, and was wondering "is there anything I can take to sleep tonight"..  Reports he went to the ED yesterday but was cleared and discharged home. Denies suicidal ideations, denies homicidal ideations.  I explained to patient that at this time, since he has been discharged from unit and is no longer under my care, I cannot prescribe any new medication for him at this time.  He does plan to follow-up at [facility] and has an appointment on 1/15.  He also has an appointment with PCP on 1/13.  Encouraged to keep these appointments.  He also understands he can go to the ED at any time should there be any significant worsening of symptoms, SI, or other  concerns about safety.  Sallyanne Havers MD

## 2018-03-27 ENCOUNTER — Other Ambulatory Visit: Payer: Self-pay

## 2018-03-27 ENCOUNTER — Encounter (HOSPITAL_COMMUNITY): Payer: Self-pay | Admitting: Emergency Medicine

## 2018-03-27 ENCOUNTER — Emergency Department (HOSPITAL_COMMUNITY)
Admission: EM | Admit: 2018-03-27 | Discharge: 2018-03-27 | Disposition: A | Discharge status: Home / Self Care | Payer: Medicaid Other | Attending: Emergency Medicine | Admitting: Emergency Medicine

## 2018-03-27 DIAGNOSIS — Z87891 Personal history of nicotine dependence: Secondary | ICD-10-CM | POA: Insufficient documentation

## 2018-03-27 DIAGNOSIS — J45909 Unspecified asthma, uncomplicated: Secondary | ICD-10-CM | POA: Insufficient documentation

## 2018-03-27 DIAGNOSIS — Z79899 Other long term (current) drug therapy: Secondary | ICD-10-CM | POA: Diagnosis not present

## 2018-03-27 DIAGNOSIS — F419 Anxiety disorder, unspecified: Secondary | ICD-10-CM | POA: Insufficient documentation

## 2018-03-27 DIAGNOSIS — R0789 Other chest pain: Secondary | ICD-10-CM

## 2018-03-27 DIAGNOSIS — Z113 Encounter for screening for infections with a predominantly sexual mode of transmission: Secondary | ICD-10-CM | POA: Diagnosis not present

## 2018-03-27 DIAGNOSIS — Z202 Contact with and (suspected) exposure to infections with a predominantly sexual mode of transmission: Secondary | ICD-10-CM

## 2018-03-27 MED ORDER — STERILE WATER FOR INJECTION IJ SOLN
INTRAMUSCULAR | Status: AC
  Administered 2018-03-27: 10 mL
  Filled 2018-03-27: qty 10

## 2018-03-27 MED ORDER — ESCITALOPRAM OXALATE 10 MG PO TABS: 5.0000 mg | ORAL_TABLET | Freq: Every day | ORAL | 0 refills | Status: AC

## 2018-03-27 MED ORDER — ESCITALOPRAM OXALATE 10 MG PO TABS
5.0000 mg | ORAL_TABLET | Freq: Once | ORAL | Status: AC
  Administered 2018-03-27: 5 mg via ORAL
  Filled 2018-03-27: qty 1

## 2018-03-27 MED ORDER — CEFTRIAXONE SODIUM 250 MG IJ SOLR
250.0000 mg | Freq: Once | INTRAMUSCULAR | Status: AC
  Administered 2018-03-27: 250 mg via INTRAMUSCULAR
  Filled 2018-03-27: qty 250

## 2018-03-27 MED ORDER — AZITHROMYCIN 250 MG PO TABS
1000.0000 mg | ORAL_TABLET | Freq: Once | ORAL | Status: AC
  Administered 2018-03-27: 1000 mg via ORAL
  Filled 2018-03-27: qty 4

## 2018-03-27 NOTE — ED Provider Notes (Signed)
[facility] EMERGENCY DEPARTMENT Provider Note   CSN: [id number] Arrival date & time: 03/27/18  1229    History   Chief Complaint Chief Complaint  Patient presents with  . Anxiety  . STD Check    HPI Francisco Cross is a 18 y.o. male.     HPI  Patient presents with his mother who after ensuring that it is okay that she is present, corroborates the HPI. Patient presents with multiple concerns. Concerns include chest pain, anxiety, exposure to STD. Patient notes that for some time he has had episodes of chest tightness, palpitations, overwhelming concern that his heart is going to stop. He denies history of congenital heart disease, or any known cardiac issues. He has no history of syncope, no chest pain with exertion. Patient notes that he was diagnosed with anxiety by physician about 2 months ago, after a period of similar restlessness, fixation on multiple maladies. He briefly was on hydroxyzine, but stopped due to uncomfortable sensations. He has not taken any medication in the past month. Patient notes that he was recently treated for chlamydia. However, the patient had unprotected sex with another male soon thereafter. He denies any dysuria or urinary complaints currently. Also denies fever, chills, dyspnea, any focal pain. No persistent skin lesions either.  Past Medical History:  Diagnosis Date  . Anxiety   . Asthma   . Depression   . Eczema     Patient Active Problem List   Diagnosis Date Noted  . Severe episode of recurrent major depressive disorder, without psychotic features (HCC)   . Panic disorder   . Psychosis (HCC) 02/10/2018    History reviewed. No pertinent surgical history.      Home Medications    Prior to Admission medications   Medication Sig Start Date End Date Taking? Authorizing Provider  escitalopram (LEXAPRO) 10 MG tablet Take 0.5 tablets (5 mg total) by mouth daily for 15 days. 03/27/18 04/11/18  Gerhard Munch, MD  hydrOXYzine (ATARAX/VISTARIL) 25 MG tablet Take 1 tablet (25 mg total) by mouth every 6 (six) hours as needed for anxiety. 02/13/18   Aldean Baker, NP  traZODone (DESYREL) 50 MG tablet Take 1 tablet (50 mg total) by mouth at bedtime. 02/14/18   Linwood Dibbles, MD    Family History Family History  Problem Relation Age of Onset  . Healthy Mother   . Healthy Father     Social History Social History   Tobacco Use  . Smoking status: Former Smoker    Packs/day: 0.25    Years: 10.00    Pack years: 2.50    Last attempt to quit: 02/10/2018    Years since quitting: 0.1  . Smokeless tobacco: Never Used  Substance Use Topics  . Alcohol use: Yes    Alcohol/week: 3.0 standard drinks    Types: 3 Shots of liquor per week    Comment: 2-3 cups liquor every day  . Drug use: Yes    Frequency: 7.0 times per week    Types: Marijuana, Oxycodone    Comment: THC 2-3          times day     Allergies   Orange fruit [citrus]   Review of Systems Review of Systems  Constitutional:       Per HPI, otherwise negative  HENT:       Per HPI, otherwise negative  Respiratory:       Per HPI, otherwise negative  Cardiovascular:  Per HPI, otherwise negative  Gastrointestinal: Negative for vomiting.  Endocrine:       Negative aside from HPI  Genitourinary:       Neg aside from HPI   Musculoskeletal:       Per HPI, otherwise negative  Skin: Negative.   Neurological: Negative for syncope and weakness.  Psychiatric/Behavioral: The patient is nervous/anxious.      Physical Exam Updated Vital Signs BP 134/83 (BP Location: Right Arm)   Pulse 84   Temp 97.9 F (36.6 C) (Oral)   Resp 18   Ht 5\' 7"  (1.702 m)   Wt 59 kg   SpO2 100%   BMI 20.36 kg/m   Physical Exam Vitals signs and nursing note reviewed.  Constitutional:      General: He is not in acute distress.    Appearance: He is well-developed.  HENT:     Head: Normocephalic and atraumatic.  Eyes:     Conjunctiva/sclera:  Conjunctivae normal.  Cardiovascular:     Rate and Rhythm: Normal rate and regular rhythm.  Pulmonary:     Effort: Pulmonary effort is normal. No respiratory distress.     Breath sounds: No stridor.  Abdominal:     General: There is no distension.  Skin:    General: Skin is warm and dry.  Neurological:     Mental Status: He is alert and oriented to person, place, and time.  Psychiatric:        Mood and Affect: Mood is anxious.      ED Treatments / Results  Labs (all labs ordered are listed, but only abnormal results are displayed) Labs Reviewed - No data to display  EKG EKG Interpretation  Date/Time:  Wednesday March 27 2018 12:36:07 EST Ventricular Rate:  82 PR Interval:  122 QRS Duration: 86 QT Interval:  348 QTC Calculation: 406 R Axis:   93 Text Interpretation:  Normal sinus rhythm with sinus arrhythmia Rightward axis Artifact Abnormal ekg Confirmed by Gerhard Munch [phone]) on 03/27/2018 2:02:51 PM   Radiology No results found.  Procedures Procedures (including critical care time)  Medications Ordered in ED Medications  escitalopram (LEXAPRO) tablet 5 mg (has no administration in time range)  cefTRIAXone (ROCEPHIN) injection 250 mg (has no administration in time range)  azithromycin (ZITHROMAX) tablet 1,000 mg (has no administration in time range)     Initial Impression / Assessment and Plan / ED Course  I have reviewed the triage vital signs and the nursing notes.  Pertinent labs & imaging results that were available during my care of the patient were reviewed by me and considered in my medical decision making (see chart for details).  All considerations discussed with the patient and his mother. This young male presents with concern of episodic chest pain, generalized discomfort, restlessness. Patient's recent diagnosis of anxiety seems appropriate, as the patient is fixating on his multiple areas of transient pain, possible cardiac concern.  On  patient has no delusions, no overt hallucination, no psychosis, but after lengthy conversation with him and his mother, will start a course of Lexapro, with close outpatient follow-up. Given the patient endorsement of unprotected sex after recent treatment for chlamydia, possibly with the same male, patient will also be treated empirically for chlamydia and gonorrhea. Patient notes recent history of testing for HIV, reportedly negative. Patient has a primary care physician with whom he will follow-up next week.  Final Clinical Impressions(s) / ED Diagnoses   Final diagnoses:  Anxiousness  Atypical chest pain  Exposure to STD    ED Discharge Orders         Ordered    escitalopram (LEXAPRO) 10 MG tablet  Daily     03/27/18 1434           Gerhard MunchLockwood, Robert, MD 03/27/18 1439

## 2018-03-27 NOTE — Discharge Instructions (Addendum)
As discussed, your evaluation today has been largely reassuring.  But, it is important that you monitor your condition carefully, and do not hesitate to return to the ED if you develop new, or concerning changes in your condition.  Otherwise, please follow-up with your physician for appropriate ongoing care.  Please be sure to do discuss today's emergency department evaluation, and the initiation of a new medication for anxiety. If the medication is well-tolerated, please consider a longer prescription, or adjustments as necessary.

## 2018-03-27 NOTE — ED Triage Notes (Signed)
Pt complains of multiple complaints to include numbness, tingling, rapid speech, chest pain, shakes, and tightening of his esophagus. Pt also reports having chlamydia and not taking his medications.

## 2018-10-01 ENCOUNTER — Other Ambulatory Visit: Payer: Self-pay

## 2018-10-01 ENCOUNTER — Emergency Department (HOSPITAL_COMMUNITY)
Admission: EM | Admit: 2018-10-01 | Discharge: 2018-10-01 | Discharge status: Against Medical Advice | Payer: Medicaid Other | Attending: Emergency Medicine | Admitting: Emergency Medicine

## 2018-10-01 DIAGNOSIS — Z5321 Procedure and treatment not carried out due to patient leaving prior to being seen by health care provider: Secondary | ICD-10-CM | POA: Insufficient documentation

## 2018-10-01 DIAGNOSIS — R0981 Nasal congestion: Secondary | ICD-10-CM | POA: Diagnosis present

## 2018-10-01 NOTE — ED Notes (Signed)
Pt. decided to leave. This tech encouraged pt. to stay and instructed him to return if symptoms worsened.

## 2019-04-17 ENCOUNTER — Emergency Department (HOSPITAL_COMMUNITY)
Admission: EM | Admit: 2019-04-17 | Discharge: 2019-04-17 | Discharge status: Against Medical Advice | Payer: Medicaid Other

## 2019-06-04 ENCOUNTER — Encounter (HOSPITAL_COMMUNITY): Payer: Self-pay | Admitting: Urgent Care

## 2019-06-04 ENCOUNTER — Other Ambulatory Visit: Payer: Self-pay

## 2019-06-04 ENCOUNTER — Ambulatory Visit (HOSPITAL_COMMUNITY)
Admission: EM | Admit: 2019-06-04 | Discharge: 2019-06-04 | Disposition: A | Discharge status: Home / Self Care | Payer: Medicaid Other

## 2019-06-04 DIAGNOSIS — R112 Nausea with vomiting, unspecified: Secondary | ICD-10-CM

## 2019-06-04 DIAGNOSIS — R000 Tachycardia, unspecified: Secondary | ICD-10-CM

## 2019-06-04 DIAGNOSIS — F1193 Opioid use, unspecified with withdrawal: Secondary | ICD-10-CM

## 2019-06-04 DIAGNOSIS — F411 Generalized anxiety disorder: Secondary | ICD-10-CM

## 2019-06-04 DIAGNOSIS — R61 Generalized hyperhidrosis: Secondary | ICD-10-CM

## 2019-06-04 MED ORDER — ONDANSETRON 8 MG PO TBDP: 8.0000 mg | ORAL_TABLET | Freq: Three times a day (TID) | ORAL | 0 refills | Status: AC | PRN

## 2019-06-04 MED ORDER — HYDROXYZINE HCL 25 MG PO TABS: 12.5000 mg | ORAL_TABLET | Freq: Three times a day (TID) | ORAL | 0 refills | Status: AC | PRN

## 2019-06-04 NOTE — ED Triage Notes (Signed)
Pt is here with vomiting after taking a fake pain pill pt took 2 days ago, pt states he is withdrawing from Perosets. Pt wants COVID testing just to be sure.
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
## 2019-12-01 IMAGING — CR DG CHEST 2V
2 series · 2 of 2 positions shown · non-contrast
Comparison: PA and lateral chest x-ray November 21, 2010

CLINICAL DATA: Possible Tylenol and Robitussin overdose over the
past week. History of asthma, current smoker.

EXAM:
CHEST - 2 VIEW

[chest pa]
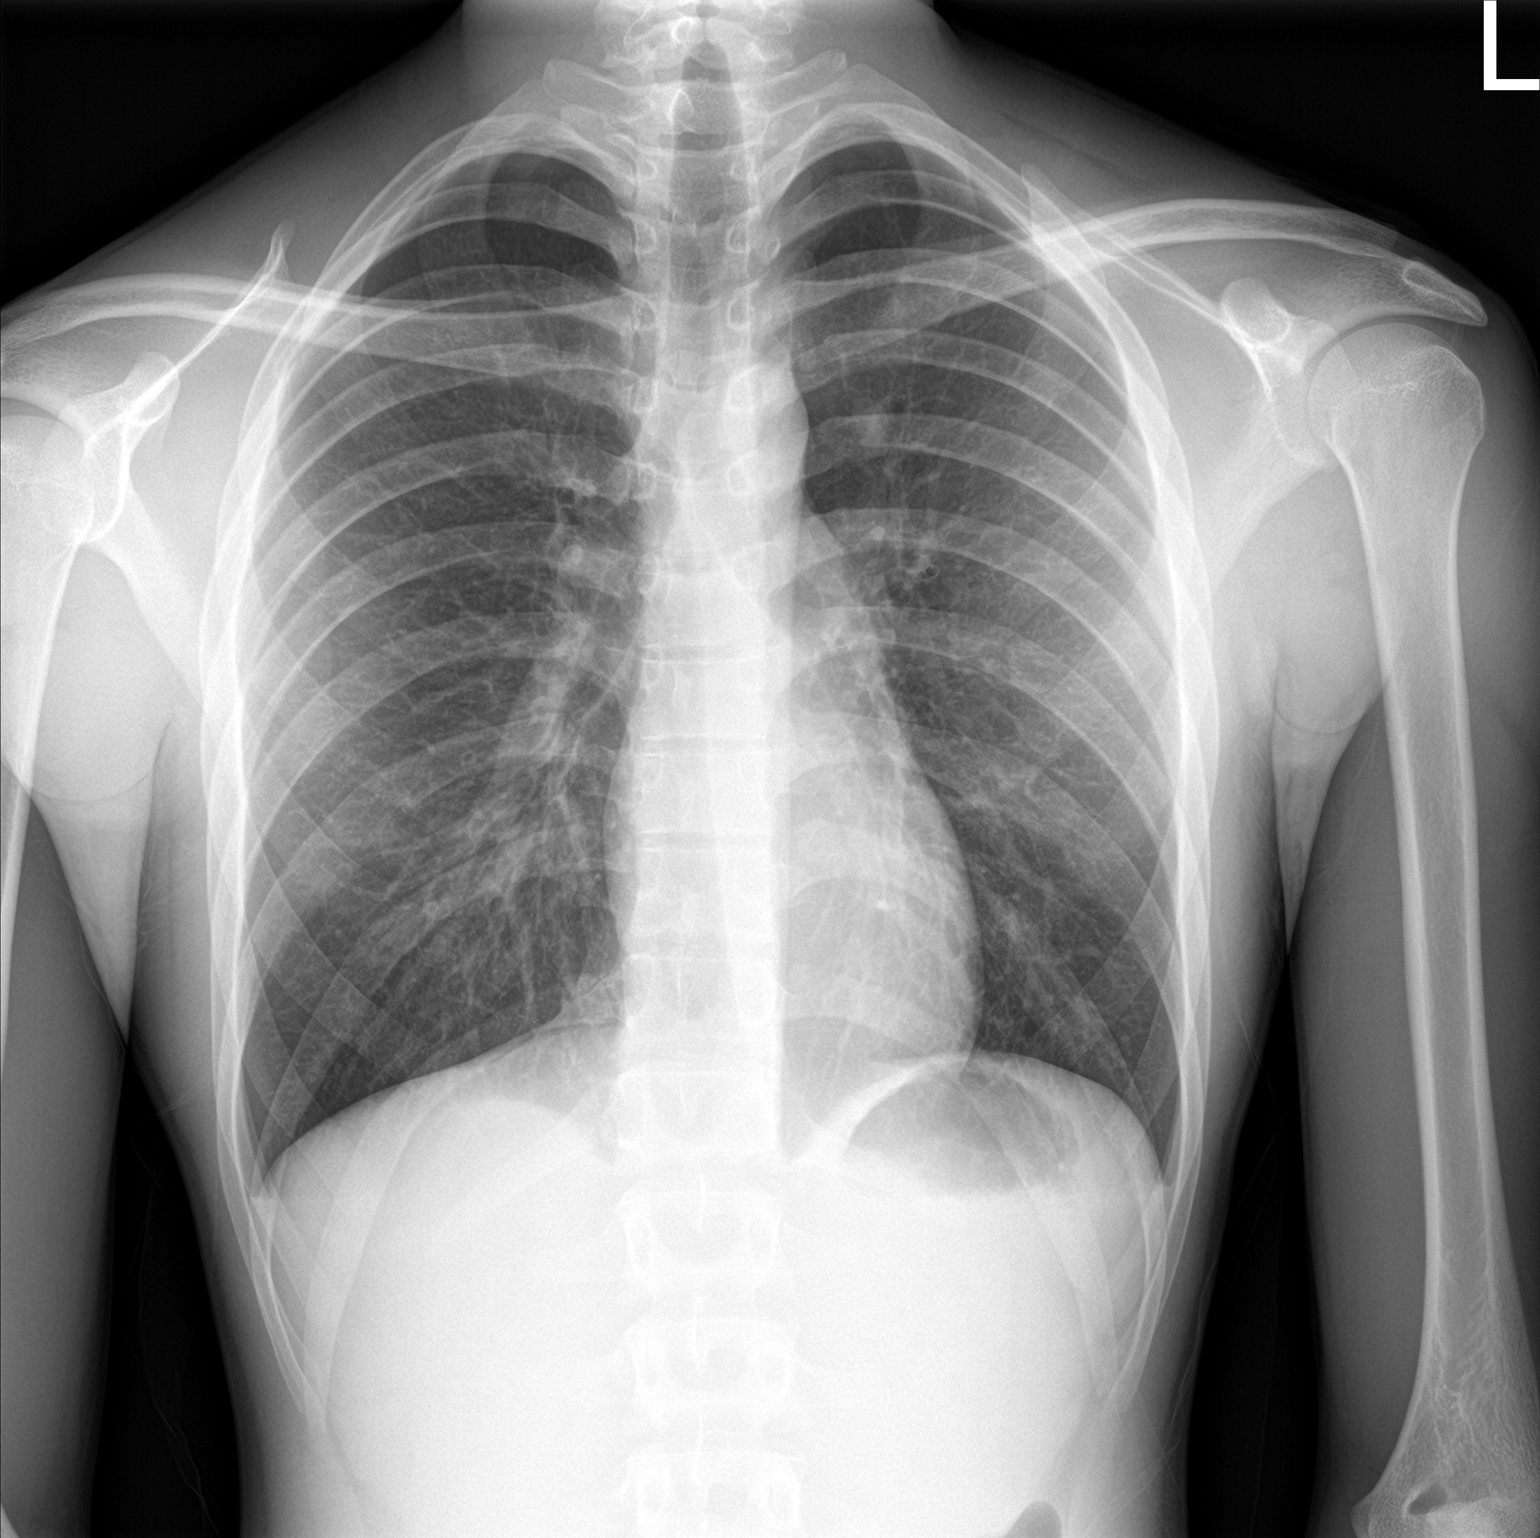

[chest lat]
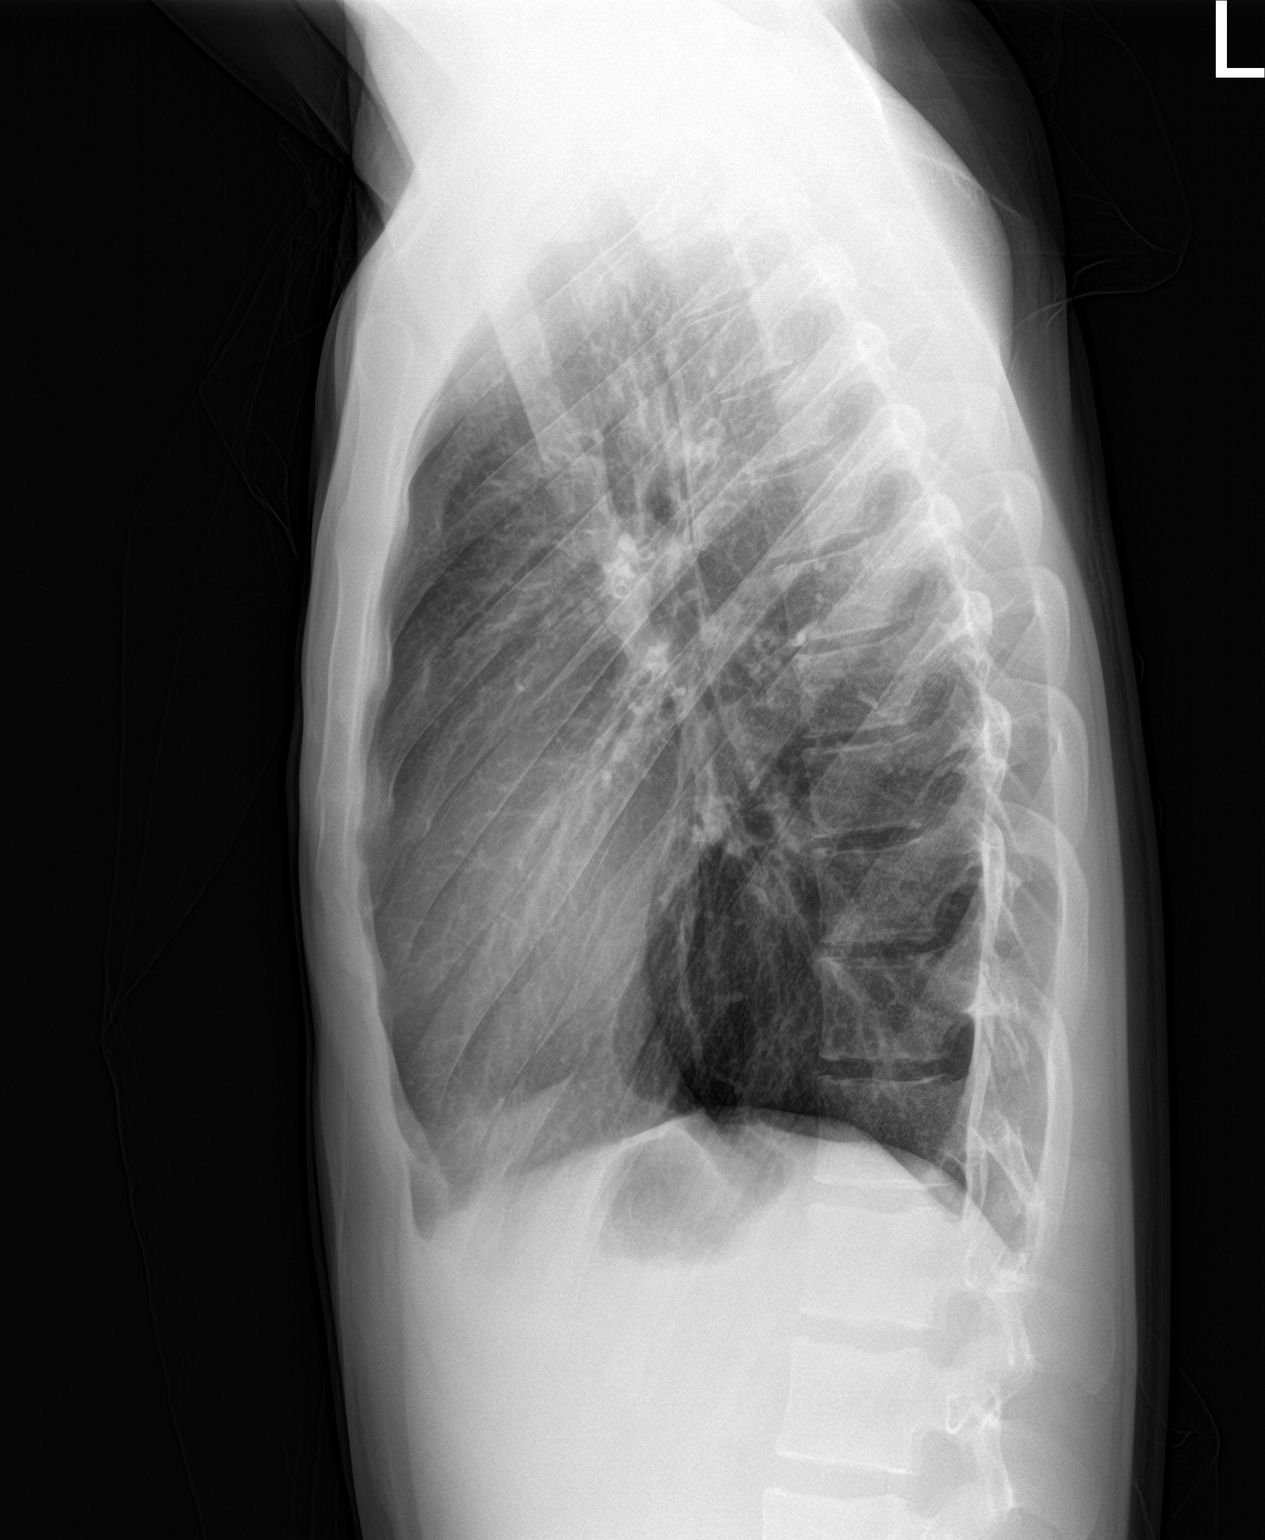

[2 of 2 positions shown; findings below may reference images not displayed]

FINDINGS: The lungs are well-expanded. There is no focal infiltrate. There is
no pleural effusion. The heart and pulmonary vascularity are normal.
The mediastinum is normal in width. The trachea is midline. The bony
thorax exhibits no acute abnormality.
IMPRESSION: There is no active cardiopulmonary disease. Mild hyperinflation may
be voluntary or may reflect the history of asthma.

## 2020-02-06 ENCOUNTER — Emergency Department (HOSPITAL_COMMUNITY)
Admission: EM | Admit: 2020-02-06 | Discharge: 2020-02-06 | Disposition: A | Discharge status: Home / Self Care | Payer: Medicaid Other | Attending: Emergency Medicine | Admitting: Emergency Medicine

## 2020-02-06 ENCOUNTER — Encounter (HOSPITAL_COMMUNITY): Payer: Self-pay | Admitting: Emergency Medicine

## 2020-02-06 ENCOUNTER — Other Ambulatory Visit: Payer: Self-pay

## 2020-02-06 DIAGNOSIS — R509 Fever, unspecified: Secondary | ICD-10-CM | POA: Diagnosis present

## 2020-02-06 DIAGNOSIS — U071 COVID-19: Secondary | ICD-10-CM | POA: Diagnosis not present

## 2020-02-06 DIAGNOSIS — Z5321 Procedure and treatment not carried out due to patient leaving prior to being seen by health care provider: Secondary | ICD-10-CM | POA: Diagnosis not present

## 2020-02-06 LAB — RESP PANEL BY RT-PCR (FLU A&B, COVID) ARPGX2
Influenza A by PCR: NEGATIVE
Influenza B by PCR: NEGATIVE
SARS Coronavirus 2 by RT PCR: POSITIVE — AB

## 2020-02-06 MED ORDER — ACETAMINOPHEN 325 MG PO TABS
650.0000 mg | ORAL_TABLET | Freq: Once | ORAL | Status: AC | PRN
  Administered 2020-02-06: 650 mg via ORAL
  Filled 2020-02-06: qty 2

## 2020-02-06 NOTE — ED Notes (Signed)
Patient refused lab draw.

## 2020-02-06 NOTE — ED Triage Notes (Signed)
Patient reports N/V/D since yesterday. Fever in triage.

## 2020-05-21 ENCOUNTER — Emergency Department (HOSPITAL_COMMUNITY)
Admission: EM | Admit: 2020-05-21 | Discharge: 2020-05-21 | Disposition: A | Discharge status: Home / Self Care | Payer: Medicaid Other | Attending: Emergency Medicine | Admitting: Emergency Medicine

## 2020-05-21 ENCOUNTER — Other Ambulatory Visit: Payer: Self-pay

## 2020-05-21 ENCOUNTER — Emergency Department (HOSPITAL_COMMUNITY): Payer: Medicaid Other

## 2020-05-21 DIAGNOSIS — S01511A Laceration without foreign body of lip, initial encounter: Secondary | ICD-10-CM | POA: Insufficient documentation

## 2020-05-21 DIAGNOSIS — Z5321 Procedure and treatment not carried out due to patient leaving prior to being seen by health care provider: Secondary | ICD-10-CM | POA: Insufficient documentation

## 2020-05-21 DIAGNOSIS — S0993XA Unspecified injury of face, initial encounter: Secondary | ICD-10-CM | POA: Diagnosis present

## 2020-05-21 NOTE — ED Notes (Signed)
Pt left lobby agitated yelling and cussing. Pt signed MSE waiver and was made aware
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
## 2020-05-22 ENCOUNTER — Emergency Department (HOSPITAL_COMMUNITY)
Admission: EM | Admit: 2020-05-22 | Discharge: 2020-05-22 | Disposition: A | Discharge status: Home / Self Care | Payer: Medicaid Other | Attending: Emergency Medicine | Admitting: Emergency Medicine

## 2020-05-22 DIAGNOSIS — R232 Flushing: Secondary | ICD-10-CM | POA: Diagnosis not present

## 2020-05-22 DIAGNOSIS — R112 Nausea with vomiting, unspecified: Secondary | ICD-10-CM | POA: Insufficient documentation

## 2020-05-22 DIAGNOSIS — Z5321 Procedure and treatment not carried out due to patient leaving prior to being seen by health care provider: Secondary | ICD-10-CM | POA: Insufficient documentation
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
## 2020-05-22 NOTE — ED Notes (Signed)
Patient states he wants to leave. This nurse explained it will be AMA and that means patient is liable and responsible for any and all issues concerning medical issue today. Patient understood and signed. AMA
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
## 2020-05-22 NOTE — ED Notes (Signed)
Patient left AMA.

## 2020-11-28 ENCOUNTER — Emergency Department (HOSPITAL_COMMUNITY)
Admission: EM | Admit: 2020-11-28 | Discharge: 2020-11-28 | Disposition: A | Discharge status: Home / Self Care | Payer: Medicaid Other | Attending: Emergency Medicine | Admitting: Emergency Medicine

## 2020-11-28 ENCOUNTER — Encounter (HOSPITAL_COMMUNITY): Payer: Self-pay | Admitting: Emergency Medicine

## 2020-11-28 DIAGNOSIS — Z5321 Procedure and treatment not carried out due to patient leaving prior to being seen by health care provider: Secondary | ICD-10-CM | POA: Insufficient documentation

## 2020-11-28 DIAGNOSIS — R112 Nausea with vomiting, unspecified: Secondary | ICD-10-CM | POA: Diagnosis not present

## 2020-11-28 DIAGNOSIS — R1010 Upper abdominal pain, unspecified: Secondary | ICD-10-CM | POA: Diagnosis present

## 2020-11-28 DIAGNOSIS — R509 Fever, unspecified: Secondary | ICD-10-CM | POA: Insufficient documentation

## 2020-11-28 NOTE — ED Provider Notes (Signed)
Emergency Medicine Provider Triage Evaluation Note  MICO SPARK , a 20 y.o. male  was evaluated in triage.  Pt complains of 3-day history of upper abdominal pain with associated nausea and vomiting.  Reports associated subjective fever and chills.  No urinary complaints.  States that his daughter was sick with gastrointestinal virus a week ago.  Review of Systems  Positive:  Negative: See above   Physical Exam  BP 124/83 (BP Location: Right Arm)   Pulse 100   Temp 99.5 F (37.5 C) (Oral)   Resp 18   SpO2 95%  Gen:   Awake, no distress   Resp:  Normal effort  MSK:   Moves extremities without difficulty  Other:  Upper abdominal tenderness  Medical Decision Making  Medically screening exam initiated at 2:14 PM.  Appropriate orders placed.  Stefen Z Harnois was informed that the remainder of the evaluation will be completed by another provider, this initial triage assessment does not replace that evaluation, and the importance of remaining in the ED until their evaluation is complete.  Instructed the patient like to get some blood work to make sure his blood counts appear okay.  Patient currently denied.   Teressa Lower, PA-C 11/28/20 1416    Sloan Leiter, DO 11/28/20 2337
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
## 2020-11-28 NOTE — ED Notes (Signed)
Called x1 for vitals, no response.

## 2021-02-02 ENCOUNTER — Emergency Department (HOSPITAL_COMMUNITY)
Admission: EM | Admit: 2021-02-02 | Discharge: 2021-02-02 | Disposition: A | Discharge status: Home / Self Care | Payer: Medicaid Other | Attending: Emergency Medicine | Admitting: Emergency Medicine

## 2021-02-02 ENCOUNTER — Encounter (HOSPITAL_COMMUNITY): Payer: Self-pay | Admitting: Emergency Medicine

## 2021-02-02 ENCOUNTER — Other Ambulatory Visit: Payer: Self-pay

## 2021-02-02 DIAGNOSIS — Z5321 Procedure and treatment not carried out due to patient leaving prior to being seen by health care provider: Secondary | ICD-10-CM | POA: Diagnosis not present

## 2021-02-02 DIAGNOSIS — K0889 Other specified disorders of teeth and supporting structures: Secondary | ICD-10-CM | POA: Diagnosis not present

## 2021-02-02 NOTE — ED Triage Notes (Signed)
C/o L upper dental pain x 2 months.  Denies fever and chills.

## 2021-02-02 NOTE — ED Notes (Signed)
PT did not answer for RM H21 PT called at 6:13

## 2021-02-02 NOTE — ED Notes (Signed)
Called for MSE w/ no answer x2

## 2023-10-19 ENCOUNTER — Emergency Department (HOSPITAL_COMMUNITY)
Admission: EM | Admit: 2023-10-19 | Discharge: 2023-10-19 | Discharge status: Against Medical Advice | Attending: Emergency Medicine | Admitting: Emergency Medicine

## 2023-10-19 ENCOUNTER — Other Ambulatory Visit: Payer: Self-pay

## 2023-10-19 DIAGNOSIS — R1084 Generalized abdominal pain: Secondary | ICD-10-CM | POA: Insufficient documentation

## 2023-10-19 DIAGNOSIS — Z5321 Procedure and treatment not carried out due to patient leaving prior to being seen by health care provider: Secondary | ICD-10-CM | POA: Diagnosis not present

## 2023-10-19 LAB — HIV ANTIBODY (ROUTINE TESTING W REFLEX): HIV Screen 4th Generation wRfx: NONREACTIVE

## 2023-10-19 LAB — RPR: RPR Ser Ql: NONREACTIVE

## 2023-10-19 NOTE — ED Notes (Addendum)
 Pt states that pelvic pain started after having unprotected sex.pelvic pain is present when urinating. States it feel funny when I use the bathroom

## 2023-10-19 NOTE — ED Notes (Signed)
 Called pt 3x for vitals, and have received no response.

## 2023-10-19 NOTE — ED Triage Notes (Signed)
 Pt  here from home with c/o gen abd pain , has been ongoing for a few days

## 2023-11-25 ENCOUNTER — Other Ambulatory Visit: Payer: Self-pay

## 2023-11-25 ENCOUNTER — Emergency Department (HOSPITAL_COMMUNITY)

## 2023-11-25 ENCOUNTER — Encounter (HOSPITAL_COMMUNITY): Payer: Self-pay

## 2023-11-25 ENCOUNTER — Emergency Department (HOSPITAL_COMMUNITY)
Admission: EM | Admit: 2023-11-25 | Discharge: 2023-11-25 | Discharge status: Against Medical Advice | Attending: Emergency Medicine | Admitting: Emergency Medicine

## 2023-11-25 DIAGNOSIS — R1031 Right lower quadrant pain: Secondary | ICD-10-CM | POA: Insufficient documentation

## 2023-11-25 DIAGNOSIS — Z5321 Procedure and treatment not carried out due to patient leaving prior to being seen by health care provider: Secondary | ICD-10-CM | POA: Diagnosis not present

## 2023-11-25 DIAGNOSIS — R1011 Right upper quadrant pain: Secondary | ICD-10-CM | POA: Diagnosis not present

## 2023-11-25 DIAGNOSIS — R112 Nausea with vomiting, unspecified: Secondary | ICD-10-CM | POA: Diagnosis not present

## 2023-11-25 DIAGNOSIS — R079 Chest pain, unspecified: Secondary | ICD-10-CM | POA: Insufficient documentation

## 2023-11-25 DIAGNOSIS — R059 Cough, unspecified: Secondary | ICD-10-CM | POA: Diagnosis not present

## 2023-11-25 LAB — CBC
HCT: 42.2 % (ref 39.0–52.0)
Hemoglobin: 13.8 g/dL (ref 13.0–17.0)
MCH: 30.7 pg (ref 26.0–34.0)
MCHC: 32.7 g/dL (ref 30.0–36.0)
MCV: 93.8 fL (ref 80.0–100.0)
Platelets: 409 K/uL — ABNORMAL HIGH (ref 150–400)
RBC: 4.5 MIL/uL (ref 4.22–5.81)
RDW: 12.5 % (ref 11.5–15.5)
WBC: 8.7 K/uL (ref 4.0–10.5)
nRBC: 0 % (ref 0.0–0.2)

## 2023-11-25 LAB — COMPREHENSIVE METABOLIC PANEL WITH GFR
ALT: 29 U/L (ref 0–44)
AST: 23 U/L (ref 15–41)
Albumin: 4.1 g/dL (ref 3.5–5.0)
Alkaline Phosphatase: 84 U/L (ref 38–126)
Anion gap: 11 (ref 5–15)
BUN: 5 mg/dL — ABNORMAL LOW (ref 6–20)
CO2: 26 mmol/L (ref 22–32)
Calcium: 9.8 mg/dL (ref 8.9–10.3)
Chloride: 101 mmol/L (ref 98–111)
Creatinine, Ser: 0.79 mg/dL (ref 0.61–1.24)
GFR, Estimated: >60 mL/min (ref >60)
Glucose, Bld: 101 mg/dL — ABNORMAL HIGH (ref 70–99)
Potassium: 3.7 mmol/L (ref 3.5–5.1)
Sodium: 138 mmol/L (ref 135–145)
Total Bilirubin: 0.6 mg/dL (ref 0.0–1.2)
Total Protein: 7.8 g/dL (ref 6.5–8.1)

## 2023-11-25 LAB — RESP PANEL BY RT-PCR (RSV, FLU A&B, COVID)  RVPGX2
Influenza A by PCR: NEGATIVE
Influenza B by PCR: NEGATIVE
Resp Syncytial Virus by PCR: NEGATIVE
SARS Coronavirus 2 by RT PCR: NEGATIVE

## 2023-11-25 LAB — LIPASE, BLOOD: Lipase: 20 U/L (ref 11–51)

## 2023-11-25 LAB — TROPONIN I (HIGH SENSITIVITY)
Troponin I (High Sensitivity): 3 ng/L (ref <18)
Troponin I (High Sensitivity): 3 ng/L (ref <18)

## 2023-11-25 NOTE — ED Provider Triage Note (Signed)
 Emergency Medicine Provider Triage Evaluation Note  Francisco Cross , a 23 y.o. male  was evaluated in triage.  Pt complains of chest pain x2 days. Right side and lower. Intermittent. Radiates down into stomach. Also without good appetite.   Review of Systems  Positive:  Negative:   Physical Exam  There were no vitals taken for this visit. Gen:   Awake, no distress   Resp:  Normal effort  MSK:   Moves extremities without difficulty  Other:    Medical Decision Making  Medically screening exam initiated at 4:52 PM.  Appropriate orders placed.  Francisco Cross was informed that the remainder of the evaluation will be completed by another provider, this initial triage assessment does not replace that evaluation, and the importance of remaining in the ED until their evaluation is complete.     Hoy Fraction F, [place] 11/25/23 [phone]

## 2023-11-25 NOTE — ED Notes (Signed)
 Pt decided to leave while waiting for a room.

## 2023-11-25 NOTE — ED Triage Notes (Signed)
 Complains of right upper quad/chest pain.  Reports intermittent nausea and vomitng.

## 2023-11-25 NOTE — ED Triage Notes (Signed)
 Pt here from home with c/o chest pain times 2 days

## 2023-11-29 ENCOUNTER — Emergency Department (HOSPITAL_COMMUNITY)
Admission: EM | Admit: 2023-11-29 | Discharge: 2023-11-30 | Disposition: A | Discharge status: Home / Self Care | Attending: Emergency Medicine | Admitting: Emergency Medicine

## 2023-11-29 ENCOUNTER — Encounter (HOSPITAL_COMMUNITY): Payer: Self-pay | Admitting: Emergency Medicine

## 2023-11-29 ENCOUNTER — Other Ambulatory Visit: Payer: Self-pay

## 2023-11-29 DIAGNOSIS — R1084 Generalized abdominal pain: Secondary | ICD-10-CM | POA: Diagnosis not present

## 2023-11-29 DIAGNOSIS — K5900 Constipation, unspecified: Secondary | ICD-10-CM | POA: Diagnosis not present

## 2023-11-29 DIAGNOSIS — R112 Nausea with vomiting, unspecified: Secondary | ICD-10-CM | POA: Insufficient documentation

## 2023-11-29 DIAGNOSIS — J069 Acute upper respiratory infection, unspecified: Secondary | ICD-10-CM | POA: Diagnosis not present

## 2023-11-29 DIAGNOSIS — R109 Unspecified abdominal pain: Secondary | ICD-10-CM

## 2023-11-29 DIAGNOSIS — R059 Cough, unspecified: Secondary | ICD-10-CM | POA: Diagnosis present

## 2023-11-29 LAB — CBC
HCT: 37.5 % — ABNORMAL LOW (ref 39.0–52.0)
Hemoglobin: 12.2 g/dL — ABNORMAL LOW (ref 13.0–17.0)
MCH: 30.4 pg (ref 26.0–34.0)
MCHC: 32.5 g/dL (ref 30.0–36.0)
MCV: 93.5 fL (ref 80.0–100.0)
Platelets: 394 K/uL (ref 150–400)
RBC: 4.01 MIL/uL — ABNORMAL LOW (ref 4.22–5.81)
RDW: 12.4 % (ref 11.5–15.5)
WBC: 9.5 K/uL (ref 4.0–10.5)
nRBC: 0 % (ref 0.0–0.2)

## 2023-11-29 LAB — LIPASE, BLOOD: Lipase: 22 U/L (ref 11–51)

## 2023-11-29 LAB — COMPREHENSIVE METABOLIC PANEL WITH GFR
ALT: 23 U/L (ref 0–44)
AST: 20 U/L (ref 15–41)
Albumin: 3.8 g/dL (ref 3.5–5.0)
Alkaline Phosphatase: 73 U/L (ref 38–126)
Anion gap: 11 (ref 5–15)
BUN: 8 mg/dL (ref 6–20)
CO2: 26 mmol/L (ref 22–32)
Calcium: 9.5 mg/dL (ref 8.9–10.3)
Chloride: 100 mmol/L (ref 98–111)
Creatinine, Ser: 0.83 mg/dL (ref 0.61–1.24)
GFR, Estimated: >60 mL/min (ref >60)
Glucose, Bld: 125 mg/dL — ABNORMAL HIGH (ref 70–99)
Potassium: 3.5 mmol/L (ref 3.5–5.1)
Sodium: 137 mmol/L (ref 135–145)
Total Bilirubin: 0.5 mg/dL (ref 0.0–1.2)
Total Protein: 7.5 g/dL (ref 6.5–8.1)

## 2023-11-29 MED ORDER — OXYCODONE-ACETAMINOPHEN 5-325 MG PO TABS
1.0000 | ORAL_TABLET | ORAL | Status: DC | PRN
  Administered 2023-11-29: 1 via ORAL
  Filled 2023-11-29: qty 1

## 2023-11-29 MED ORDER — ONDANSETRON 4 MG PO TBDP
4.0000 mg | ORAL_TABLET | Freq: Once | ORAL | Status: AC | PRN
Start: 2023-11-29 — End: 2023-11-29
  Administered 2023-11-29: 4 mg via ORAL
  Filled 2023-11-29: qty 1

## 2023-11-29 NOTE — ED Triage Notes (Signed)
 Pt report lower abdominal pain that irradiates to the epigastric area and chest. Started around 1900, report no relief by lying down. Alert and oriented, ambulatory on triage

## 2023-11-29 NOTE — ED Triage Notes (Signed)
 Pt states that he has been having abd pain with N/V that started 1900. Pt states that the abd pain is radiating to chest. Pt states that he had a similar episode x 1 week ago. Pt states that he is uncomfortable in most positions including sitting, standing, lying.

## 2023-11-30 ENCOUNTER — Emergency Department (HOSPITAL_COMMUNITY)

## 2023-11-30 LAB — URINALYSIS, ROUTINE W REFLEX MICROSCOPIC
Bacteria, UA: NONE SEEN
Bilirubin Urine: NEGATIVE
Glucose, UA: NEGATIVE mg/dL
Hgb urine dipstick: NEGATIVE
Ketones, ur: 5 mg/dL — AB
Leukocytes,Ua: NEGATIVE
Nitrite: NEGATIVE
Protein, ur: NEGATIVE mg/dL
Specific Gravity, Urine: 1.011 (ref 1.005–1.030)
pH: 6 (ref 5.0–8.0)

## 2023-11-30 MED ORDER — PROMETHAZINE-DM 6.25-15 MG/5ML PO SYRP: 5.0000 mL | ORAL_SOLUTION | Freq: Four times a day (QID) | ORAL | 0 refills | Status: AC | PRN

## 2023-11-30 MED ORDER — HYDROCODONE BIT-HOMATROP MBR 5-1.5 MG/5ML PO SOLN: 5.0000 mL | Freq: Every evening | ORAL | 0 refills | Status: DC | PRN

## 2023-11-30 MED ORDER — IOHEXOL 350 MG/ML SOLN
75.0000 mL | Freq: Once | INTRAVENOUS | Status: AC | PRN
  Administered 2023-11-30: 75 mL via INTRAVENOUS

## 2023-11-30 MED ORDER — BENZONATATE 200 MG PO CAPS: 200.0000 mg | ORAL_CAPSULE | Freq: Three times a day (TID) | ORAL | 0 refills | Status: AC | PRN

## 2023-11-30 NOTE — ED Provider Notes (Signed)
 [place] EMERGENCY DEPARTMENT AT [facility] Provider Note   CSN: [id number] Arrival date & time: 11/29/23  2204     Patient presents with: Abdominal Pain   Francisco Cross is a 23 y.o. male.   Patient presents to the department for evaluation of abdominal pain.  Patient reports that symptoms began this afternoon.  He has had nausea and vomiting and diffuse lower abdominal pain.  He thinks he might be constipated.  Patient reports that he has been sick for couple of weeks with cough and congestion.  He has been taking NyQuil and OTC medications without relief.  Reports a lot of chest congestion.       Prior to Admission medications   Medication Sig Start Date End Date Taking? Authorizing Provider  benzonatate  (TESSALON ) 200 MG capsule Take 1 capsule (200 mg total) by mouth 3 (three) times daily as needed for cough. 11/30/23  Yes Delaila Nand, Lonni PARAS, MD  promethazine -dextromethorphan (PROMETHAZINE -DM) 6.25-15 MG/5ML syrup Take 5 mLs by mouth 4 (four) times daily as needed for cough. 11/30/23  Yes Jennica Tagliaferri, Lonni PARAS, MD  albuterol  (VENTOLIN  HFA) 108 (90 Base) MCG/ACT inhaler SMARTSIG:2 Puff(s) By Mouth 4 Times Daily PRN 04/17/19   [provider]  escitalopram  (LEXAPRO ) 10 MG tablet Take 0.5 tablets (5 mg total) by mouth daily for 15 days. 03/27/18 04/11/18  Garrick Charleston, MD  hydrOXYzine  (ATARAX /VISTARIL ) 25 MG tablet Take 0.5-1 tablets (12.5-25 mg total) by mouth every 8 (eight) hours as needed for anxiety (insomnia). 06/04/19   Ruffin Lada Savannah, PA-C  ipratropium (ATROVENT) 0.03 % nasal spray Place 2 sprays into both nostrils 2 (two) times daily. 04/17/19   [provider]  ondansetron  (ZOFRAN -ODT) 8 MG disintegrating tablet Take 1 tablet (8 mg total) by mouth every 8 (eight) hours as needed for nausea or vomiting. 06/04/19   Rayjon Wery Savannah, PA-C  traZODone  (DESYREL ) 50 MG tablet Take 1 tablet (50 mg total) by mouth at bedtime. 02/14/18   Randol Simmonds, MD     Allergies: Orange fruit [citrus]    Review of Systems  Updated Vital Signs BP (!) 141/92 (BP Location: Right Arm)   Pulse (!) 109   Temp 99 F (37.2 C)   Resp 20   Ht 5' 9 (1.753 m)   Wt 59 kg   SpO2 99%   BMI 19.21 kg/m   Physical Exam Vitals and nursing note reviewed.  Constitutional:      General: He is not in acute distress.    Appearance: He is well-developed.  HENT:     Head: Normocephalic and atraumatic.     Mouth/Throat:     Mouth: Mucous membranes are moist.  Eyes:     General: Vision grossly intact. Gaze aligned appropriately.     Extraocular Movements: Extraocular movements intact.     Conjunctiva/sclera: Conjunctivae normal.  Cardiovascular:     Rate and Rhythm: Normal rate and regular rhythm.     Pulses: Normal pulses.     Heart sounds: Normal heart sounds, S1 normal and S2 normal. No murmur heard.    No friction rub. No gallop.  Pulmonary:     Effort: Pulmonary effort is normal. No respiratory distress.     Breath sounds: Normal breath sounds.  Abdominal:     Palpations: Abdomen is soft.     Tenderness: There is abdominal tenderness in the right lower quadrant, suprapubic area and left lower quadrant. There is no guarding or rebound.     Hernia: No hernia is present.  Musculoskeletal:        General: No swelling.     Cervical back: Full passive range of motion without pain, normal range of motion and neck supple. No pain with movement, spinous process tenderness or muscular tenderness. Normal range of motion.     Right lower leg: No edema.     Left lower leg: No edema.  Skin:    General: Skin is warm and dry.     Capillary Refill: Capillary refill takes less than 2 seconds.     Findings: No ecchymosis, erythema, lesion or wound.  Neurological:     Mental Status: He is alert and oriented to person, place, and time.     GCS: GCS eye subscore is 4. GCS verbal subscore is 5. GCS motor subscore is 6.     Cranial Nerves: Cranial nerves 2-12 are  intact.     Sensory: Sensation is intact.     Motor: Motor function is intact. No weakness or abnormal muscle tone.     Coordination: Coordination is intact.  Psychiatric:        Mood and Affect: Mood normal.        Speech: Speech normal.        Behavior: Behavior normal.     (all labs ordered are listed, but only abnormal results are displayed) Labs Reviewed  COMPREHENSIVE METABOLIC PANEL WITH GFR - Abnormal; Notable for the following components:      Result Value   Glucose, Bld 125 (*)    All other components within normal limits  CBC - Abnormal; Notable for the following components:   RBC 4.01 (*)    Hemoglobin 12.2 (*)    HCT 37.5 (*)    All other components within normal limits  URINALYSIS, ROUTINE W REFLEX MICROSCOPIC - Abnormal; Notable for the following components:   Ketones, ur 5 (*)    All other components within normal limits  LIPASE, BLOOD    EKG: EKG Interpretation Date/Time:  Thursday November 29 2023 22:44:00 EDT Ventricular Rate:  85 PR Interval:  130 QRS Duration:  92 QT Interval:  344 QTC Calculation: 409 R Axis:   73  Text Interpretation: Normal sinus rhythm Normal ECG Confirmed by Haze Lonni PARAS [phone]) on 11/30/2023 2:19:22 AM  Radiology: CT CHEST ABDOMEN PELVIS W CONTRAST Result Date: 11/30/2023 EXAM: CT CHEST, ABDOMEN AND PELVIS WITH CONTRAST 11/30/2023 04:29:11 AM TECHNIQUE: CT of the chest, abdomen and pelvis was performed with the administration of 75 mL of iohexol (OMNIPAQUE) 350 MG/ML injection. Multiplanar reformatted images are provided for review. Automated exposure control, iterative reconstruction, and/or weight based adjustment of the mA/kV was utilized to reduce the radiation dose to as low as reasonably achievable. COMPARISON: None available. CLINICAL HISTORY: Abdominal pain, acute, nonlocalized. Pt states that he has been having abd pain with N/V that started 1900. Pt states that the abd pain is radiating to chest. Pt states that  he had a similar episode x 1 week ago. Pt states that he is uncomfortable in most positions including sitting, standing, lying. FINDINGS: CHEST: MEDIASTINUM AND LYMPH NODES: Anterior mediastinal soft tissue density compatible with residual thymus tissue. Heart and pericardium are unremarkable. The central airways are clear. No mediastinal, hilar or axillary lymphadenopathy. LUNGS AND PLEURA: Well circumscribed nodule within the left upper lobe is identified measuring 1.1 cm, image 17/2. This measures 84 Hounsfield Units. No focal consolidation or pulmonary edema. No pleural effusion or pneumothorax. ABDOMEN AND PELVIS: LIVER: The liver is unremarkable. GALLBLADDER AND BILE DUCTS:  Gallbladder is unremarkable. No biliary ductal dilatation. SPLEEN: The spleen is within normal limits in size and appearance. PANCREAS: Pancreas is normal in size and contour without focal lesion or ductal dilatation. ADRENAL GLANDS: Normal size and morphology bilaterally. No nodule, thickening, or hemorrhage. No periadrenal stranding. KIDNEYS, URETERS AND BLADDER: No stones in the kidneys or ureters. No hydronephrosis. No perinephric or periureteral stranding. Urinary bladder is unremarkable. GI AND BOWEL: Small hiatal hernia. Stomach demonstrates no acute abnormality. There is no bowel obstruction. REPRODUCTIVE ORGANS: Prostate gland appears normal. PERITONEUM AND RETROPERITONEUM: No ascites. No free air. VASCULATURE: Aorta is normal in caliber. ABDOMINAL AND PELVIS LYMPH NODES: No lymphadenopathy. BONES AND SOFT TISSUES: No acute osseous abnormality. No focal soft tissue abnormality. IMPRESSION: 1. No acute abnormality of the chest, abdomen, and pelvis. 2. 1.1 cm solid nodule within the left upper lobe. There are no specific guidelines for patients in this age group. In the absence of a known malignancy, follow-up CT of the chest in 3 to 6 months is recommended to assess for interval growth. Electronically signed by: Waddell Calk MD  11/30/2023 05:54 AM EDT RP Workstation: [id number]   DG Chest 2 View Result Date: 11/30/2023 CLINICAL DATA:  Cough for 2 weeks EXAM: CHEST - 2 VIEW COMPARISON:  11/25/2023 FINDINGS: Cardiac shadow is within normal limits. A somewhat nodular density is noted in the left suprahilar region on both the frontal and lateral projections. It is slightly more prominent than that seen on prior exams. No effusion is seen. No other focal abnormality is noted. IMPRESSION: Somewhat nodular density in the left suprahilar region. This is likely benign in etiology although noncontrast CT is recommended for further evaluation. Electronically Signed   By: Oneil Devonshire M.D.   On: 11/30/2023 03:38     Procedures   Medications Ordered in the ED  oxyCODONE-acetaminophen  (PERCOCET/ROXICET) 5-325 MG per tablet 1 tablet (1 tablet Oral Given 11/29/23 2241)  ondansetron  (ZOFRAN -ODT) disintegrating tablet 4 mg (4 mg Oral Given 11/29/23 2242)  iohexol (OMNIPAQUE) 350 MG/ML injection 75 mL (75 mLs Intravenous Contrast Given 11/30/23 0424)                                    Medical Decision Making Amount and/or Complexity of Data Reviewed Labs: ordered. Radiology: ordered.  Risk Prescription drug management.   Presents to the emergency department for persisting cough and cold symptoms as well as abdominal pain.  Patient appears well.  Oxygen saturations are normal.  Chest x-ray with nonspecific opacity.  Patient underwent CT chest abdomen and pelvis to further evaluate his complaints.  He has a small nodule on the chest that does not appear suspicious.  No further recommendations per radiology.  Abdomen and pelvis without acute findings.  Continue to treat symptomatically.     Final diagnoses:  Abdominal pain, unspecified abdominal location  Upper respiratory tract infection, unspecified type    ED Discharge Orders          Ordered    benzonatate  (TESSALON ) 200 MG capsule  3 times daily PRN        11/30/23  0547    HYDROcodone bit-homatropine (HYCODAN) 5-1.5 MG/5ML syrup  At bedtime PRN,   Status:  Discontinued        11/30/23 0547    promethazine -dextromethorphan (PROMETHAZINE -DM) 6.25-15 MG/5ML syrup  4 times daily PRN        11/30/23 0558  Haze Lonni PARAS, MD 11/30/23 [phone]

## 2023-12-02 ENCOUNTER — Other Ambulatory Visit: Payer: Self-pay

## 2023-12-02 ENCOUNTER — Emergency Department (HOSPITAL_COMMUNITY)
Admission: EM | Admit: 2023-12-02 | Discharge: 2023-12-02 | Disposition: A | Discharge status: Home / Self Care | Attending: Emergency Medicine | Admitting: Emergency Medicine

## 2023-12-02 DIAGNOSIS — F419 Anxiety disorder, unspecified: Secondary | ICD-10-CM | POA: Insufficient documentation

## 2023-12-02 DIAGNOSIS — R531 Weakness: Secondary | ICD-10-CM | POA: Insufficient documentation

## 2023-12-02 LAB — CBC
HCT: 43.4 % (ref 39.0–52.0)
Hemoglobin: 13.9 g/dL (ref 13.0–17.0)
MCH: 30.1 pg (ref 26.0–34.0)
MCHC: 32 g/dL (ref 30.0–36.0)
MCV: 93.9 fL (ref 80.0–100.0)
Platelets: 444 K/uL — ABNORMAL HIGH (ref 150–400)
RBC: 4.62 MIL/uL (ref 4.22–5.81)
RDW: 12.5 % (ref 11.5–15.5)
WBC: 8.1 K/uL (ref 4.0–10.5)
nRBC: 0 % (ref 0.0–0.2)

## 2023-12-02 LAB — COMPREHENSIVE METABOLIC PANEL WITH GFR
ALT: 23 U/L (ref 0–44)
AST: 25 U/L (ref 15–41)
Albumin: 4.9 g/dL (ref 3.5–5.0)
Alkaline Phosphatase: 116 U/L (ref 38–126)
Anion gap: 15 (ref 5–15)
BUN: 7 mg/dL (ref 6–20)
CO2: 24 mmol/L (ref 22–32)
Calcium: 10.8 mg/dL — ABNORMAL HIGH (ref 8.9–10.3)
Chloride: 103 mmol/L (ref 98–111)
Creatinine, Ser: 0.78 mg/dL (ref 0.61–1.24)
GFR, Estimated: >60 mL/min (ref >60)
Glucose, Bld: 103 mg/dL — ABNORMAL HIGH (ref 70–99)
Potassium: 3.8 mmol/L (ref 3.5–5.1)
Sodium: 141 mmol/L (ref 135–145)
Total Bilirubin: 0.4 mg/dL (ref 0.0–1.2)
Total Protein: 8.4 g/dL — ABNORMAL HIGH (ref 6.5–8.1)

## 2023-12-02 LAB — URINE DRUG SCREEN
Amphetamines: NEGATIVE
Barbiturates: NEGATIVE
Benzodiazepines: NEGATIVE
Cocaine: NEGATIVE
Fentanyl: NEGATIVE
Methadone Scn, Ur: NEGATIVE
Opiates: NEGATIVE
Tetrahydrocannabinol: POSITIVE — AB

## 2023-12-02 LAB — URINALYSIS, ROUTINE W REFLEX MICROSCOPIC
Bilirubin Urine: NEGATIVE
Glucose, UA: NEGATIVE mg/dL
Hgb urine dipstick: NEGATIVE
Ketones, ur: NEGATIVE mg/dL
Leukocytes,Ua: NEGATIVE
Nitrite: NEGATIVE
Protein, ur: NEGATIVE mg/dL
Specific Gravity, Urine: 1.01 (ref 1.005–1.030)
pH: 7 (ref 5.0–8.0)

## 2023-12-02 LAB — CBG MONITORING, ED: Glucose-Capillary: 94 mg/dL (ref 70–99)

## 2023-12-02 MED ORDER — LACTATED RINGERS IV BOLUS
1000.0000 mL | Freq: Once | INTRAVENOUS | Status: AC
  Administered 2023-12-02: 1000 mL via INTRAVENOUS

## 2023-12-02 MED ORDER — ONDANSETRON 4 MG PO TBDP
4.0000 mg | ORAL_TABLET | Freq: Once | ORAL | Status: DC
Start: 2023-12-02 — End: 2023-12-02
  Filled 2023-12-02: qty 1

## 2023-12-02 NOTE — ED Triage Notes (Addendum)
 Patient to ED by EMS from home with c/o weakness and anxiety. Reported that he jus doesn't feel good and he feels weak. Voices that he smokes weed daily and he was taking 1 percocet a day for tooth pain. Denies SI/HI. 118HR, 98% RA, 16RR

## 2023-12-02 NOTE — ED Notes (Signed)
 Patient left AMA refused to sign form states he needs rehab not the ED. Refused Zofran  that was ordered for his c/o nausea.

## 2023-12-02 NOTE — ED Provider Notes (Signed)
 [place] EMERGENCY DEPARTMENT AT [facility] Provider Note   CSN: [id number] Arrival date & time: 12/02/23  1102     Patient presents with: Anxiety   Francisco Cross is a 23 y.o. male with history of major depression, panic disorder who presents emergency department for evaluation of anxiety and weakness.  Patient states he recently had a possible URI over the last week but never tested positive for anything.  He does report diarrhea.  No nausea or vomiting.  Patient reports feeling weak since that time and is concerned about possible dehydration.  In addition, patient states he was having some dental related pain and was taking Percocet as needed.  When he took the Percocet last night, he began to panic and become anxious that something was not right.  He does have a history of using THC.  He currently denies chest pain, shortness of breath, abdominal pain.  He is reporting some dysuria and requesting STI screening.  Patient does appear to be undergoing more than normal stress due to caring for his grandmother.    Anxiety       Prior to Admission medications   Medication Sig Start Date End Date Taking? Authorizing Provider  albuterol  (VENTOLIN  HFA) 108 (90 Base) MCG/ACT inhaler SMARTSIG:2 Puff(s) By Mouth 4 Times Daily PRN 04/17/19   [provider]  benzonatate  (TESSALON ) 200 MG capsule Take 1 capsule (200 mg total) by mouth 3 (three) times daily as needed for cough. 11/30/23   Haze Lonni PARAS, MD  escitalopram  (LEXAPRO ) 10 MG tablet Take 0.5 tablets (5 mg total) by mouth daily for 15 days. 03/27/18 04/11/18  Garrick Charleston, MD  hydrOXYzine  (ATARAX /VISTARIL ) 25 MG tablet Take 0.5-1 tablets (12.5-25 mg total) by mouth every 8 (eight) hours as needed for anxiety (insomnia). 06/04/19   Christopher Savannah, PA-C  ipratropium (ATROVENT) 0.03 % nasal spray Place 2 sprays into both nostrils 2 (two) times daily. 04/17/19   [provider]  ondansetron  (ZOFRAN -ODT) 8  MG disintegrating tablet Take 1 tablet (8 mg total) by mouth every 8 (eight) hours as needed for nausea or vomiting. 06/04/19   Christopher Savannah, PA-C  promethazine -dextromethorphan (PROMETHAZINE -DM) 6.25-15 MG/5ML syrup Take 5 mLs by mouth 4 (four) times daily as needed for cough. 11/30/23   Haze Lonni PARAS, MD  traZODone  (DESYREL ) 50 MG tablet Take 1 tablet (50 mg total) by mouth at bedtime. 02/14/18   Randol Simmonds, MD    Allergies: Orange fruit [citrus]    Review of Systems  Genitourinary:  Positive for dysuria.    Updated Vital Signs BP (!) 141/88 (BP Location: Left Arm)   Pulse 94   Temp 98.2 F (36.8 C) (Oral)   Resp 18   Ht 5' 8 (1.727 m)   Wt 83 kg   SpO2 99%   BMI 27.83 kg/m   Physical Exam Vitals and nursing note reviewed.  Constitutional:      Appearance: Normal appearance.  HENT:     Head: Normocephalic and atraumatic.     Mouth/Throat:     Mouth: Mucous membranes are moist.  Eyes:     General: No scleral icterus.       Right eye: No discharge.        Left eye: No discharge.     Conjunctiva/sclera: Conjunctivae normal.  Cardiovascular:     Rate and Rhythm: Normal rate and regular rhythm.     Pulses: Normal pulses.  Pulmonary:     Effort: Pulmonary effort is normal.  Breath sounds: Normal breath sounds.  Abdominal:     General: There is no distension.     Tenderness: There is no abdominal tenderness.  Musculoskeletal:        General: No deformity.     Cervical back: Normal range of motion.  Skin:    General: Skin is warm and dry.     Capillary Refill: Capillary refill takes less than 2 seconds.  Neurological:     Mental Status: He is alert.     Motor: No weakness.  Psychiatric:        Mood and Affect: Mood normal.     (all labs ordered are listed, but only abnormal results are displayed) Labs Reviewed  COMPREHENSIVE METABOLIC PANEL WITH GFR - Abnormal; Notable for the following components:      Result Value   Glucose, Bld 103 (*)    Calcium  10.8 (*)    Total Protein 8.4 (*)    All other components within normal limits  CBC - Abnormal; Notable for the following components:   Platelets 444 (*)    All other components within normal limits  URINE DRUG SCREEN - Abnormal; Notable for the following components:   Tetrahydrocannabinol POSITIVE (*)    All other components within normal limits  URINALYSIS, ROUTINE W REFLEX MICROSCOPIC  CBG MONITORING, ED  GC/CHLAMYDIA PROBE AMP ([place]) NOT AT [facility]    EKG: None  Radiology: No results found.   Procedures   Medications Ordered in the ED  ondansetron  (ZOFRAN -ODT) disintegrating tablet 4 mg (4 mg Oral Not Given 12/02/23 1306)  lactated ringers bolus 1,000 mL (0 mLs Intravenous Stopped 12/02/23 1251)                                 Medical Decision Making Amount and/or Complexity of Data Reviewed Labs: ordered.  Risk Prescription drug management.   This patient presents to the ED for concern of anxiety and weakness, this involves an extensive number of treatment options, and is a complaint that carries with it a high risk of complications and morbidity.   Differential diagnosis includes: Panic disorder, substance-induced psychosis, dehydration, URI  Co morbidities:  history of major depressive disorder   Additional history:  Per chart review, patient was evaluated in the emergency department on 10/24 for abdominal pain.  Per provider note, patient has had nausea, vomiting and lower abdominal pain at that time.  Patient underwent CT chest abdomen pelvis at that time, with no acute findings.  Only recommendation at that time was to consider a repeat chest CT in 3 to 6 months given finding of a small nodule in the absence of malignancy.  Lab Tests:  I Ordered, and personally interpreted labs.  The pertinent results include: No acute abnormalities  Imaging Studies: Not indicated  Cardiac Monitoring/ECG:  The patient was maintained on a cardiac monitor.  I  personally viewed and interpreted the cardiac monitored which showed an underlying rhythm of: Normal sinus  Medicines ordered and prescription drug management:  I ordered medication including  Medications  ondansetron  (ZOFRAN -ODT) disintegrating tablet 4 mg (4 mg Oral Not Given 12/02/23 1306)  lactated ringers bolus 1,000 mL (0 mLs Intravenous Stopped 12/02/23 1251)   for dehydration Reevaluation of the patient after these medicines showed that the patient improved I have reviewed the patients home medicines and have made adjustments as needed  Test Considered:   none  Critical Interventions:  none  Consultations Obtained: None  Problem List / ED Course:     ICD-10-CM   1. Anxiety  F41.9       MDM: 23 year old male who presents emergency department for evaluation of anxiety, weakness and dysuria.  Patient reports recently being diagnosed with URI about 1 week ago and has not felt well since then.  On 10/24 he reported to the emergency department for evaluation of abdominal pain as well as persistent cough.  He was given a dose of Percocet and Zofran  for pain management and nausea.  He was sent home with cough suppressants.  Patient also reports taking Percocet over the last week for what he states is dental related pain.  I cannot corroborate this information in his chart.  Patient does admit to taking most recent dose of Percocet last night before bed and that is when he became anxious.  He is currently denying chest pain, shortness of breath, palpitations.  He does not report any abdominal pain, nausea, vomiting at this time.  He does report recent diarrhea last week.  I ordered IV fluids for patient comfort.  He is also endorsing dysuria and requesting STI screening.  GC/chlamydia pending.  UA shows no evidence of UTI. Given lack of findings on CT on 10/24 I do not feel strongly about repeating this exam.   Clinical Course as of 12/02/23 1325  Sun Dec 02, 2023  1258 At this  time, RN informed me that patient is reporting Percocet dependence.  Per RN, the patient states he needs help because he is worried that if he gets discharged he will go back and use.  He does appear to be tearing and agitated. [GD]    Clinical Course User Index [GD] Torrence Marry RAMAN, PA-C   When I went to go reassess the patient, the nurse reported that he left the department stating I do not need to be in the ED.  I need to go to rehab.  I did not have a chance to speak with the patient before elopement.  While his workup did look reassuring and he does not appear to have any life-threatening symptoms at this time, his workup was not yet complete.  I did not have a opportunity to explain the potential risks of the patient leaving prior to complete evaluation.  Dispostion:  After consideration of the diagnostic results and the patients response to treatment, I feel that the patient would benefit from behavioral health follow-up and more comprehensive workup.    Final diagnoses:  Anxiety    ED Discharge Orders     None          Torrence Marry RAMAN DEVONNA 12/02/23 1325    Francesca Elsie CROME, MD 12/03/23 [phone]

## 2023-12-03 LAB — GC/CHLAMYDIA PROBE AMP (~~LOC~~) NOT AT ARMC
Chlamydia: NEGATIVE
Comment: NEGATIVE
Comment: NORMAL
Neisseria Gonorrhea: NEGATIVE
